# Patient Record
Sex: Female | Born: 1975 | Race: White | Hispanic: Yes | Smoking: Current every day smoker
Health system: Southern US, Community
[De-identification: ages and names within clinical notes are randomized; demographics above are authoritative.]

## PROBLEM LIST (undated history)

## (undated) DIAGNOSIS — F329 Major depressive disorder, single episode, unspecified: Secondary | ICD-10-CM

## (undated) DIAGNOSIS — F419 Anxiety disorder, unspecified: Secondary | ICD-10-CM

## (undated) DIAGNOSIS — Z5189 Encounter for other specified aftercare: Secondary | ICD-10-CM

## (undated) DIAGNOSIS — R569 Unspecified convulsions: Secondary | ICD-10-CM

## (undated) DIAGNOSIS — M1990 Unspecified osteoarthritis, unspecified site: Secondary | ICD-10-CM

## (undated) DIAGNOSIS — A879 Viral meningitis, unspecified: Secondary | ICD-10-CM

## (undated) DIAGNOSIS — F32A Depression, unspecified: Secondary | ICD-10-CM

---

## 2004-07-28 DIAGNOSIS — IMO0001 Reserved for inherently not codable concepts without codable children: Secondary | ICD-10-CM

## 2004-07-28 DIAGNOSIS — Z5189 Encounter for other specified aftercare: Secondary | ICD-10-CM

## 2004-07-28 HISTORY — PX: OTHER SURGICAL HISTORY: SHX169

## 2004-07-28 HISTORY — DX: Encounter for other specified aftercare: Z51.89

## 2004-07-28 HISTORY — DX: Reserved for inherently not codable concepts without codable children: IMO0001

## 2008-05-01 ENCOUNTER — Emergency Department (HOSPITAL_COMMUNITY): Admission: EM | Admit: 2008-05-01 | Discharge: 2008-05-01 | Payer: Self-pay | Admitting: Emergency Medicine

## 2008-05-16 ENCOUNTER — Inpatient Hospital Stay (HOSPITAL_COMMUNITY): Admission: AD | Admit: 2008-05-16 | Discharge: 2008-05-16 | Payer: Self-pay | Admitting: Obstetrics & Gynecology

## 2008-05-21 ENCOUNTER — Encounter: Payer: Self-pay | Admitting: Obstetrics and Gynecology

## 2008-05-21 ENCOUNTER — Ambulatory Visit (HOSPITAL_COMMUNITY): Admission: AD | Admit: 2008-05-21 | Discharge: 2008-05-21 | Payer: Self-pay | Admitting: Obstetrics and Gynecology

## 2008-05-21 ENCOUNTER — Ambulatory Visit: Payer: Self-pay | Admitting: Obstetrics and Gynecology

## 2008-05-23 ENCOUNTER — Emergency Department (HOSPITAL_COMMUNITY): Admission: EM | Admit: 2008-05-23 | Discharge: 2008-05-23 | Payer: Self-pay | Admitting: Emergency Medicine

## 2008-06-14 ENCOUNTER — Emergency Department (HOSPITAL_COMMUNITY): Admission: EM | Admit: 2008-06-14 | Discharge: 2008-06-14 | Payer: Self-pay | Admitting: Emergency Medicine

## 2008-06-27 ENCOUNTER — Emergency Department (HOSPITAL_COMMUNITY): Admission: EM | Admit: 2008-06-27 | Discharge: 2008-06-27 | Payer: Self-pay | Admitting: Emergency Medicine

## 2008-07-12 ENCOUNTER — Emergency Department (HOSPITAL_COMMUNITY): Admission: EM | Admit: 2008-07-12 | Discharge: 2008-07-12 | Payer: Self-pay | Admitting: Family Medicine

## 2008-07-26 ENCOUNTER — Emergency Department (HOSPITAL_COMMUNITY): Admission: EM | Admit: 2008-07-26 | Discharge: 2008-07-26 | Payer: Self-pay | Admitting: Emergency Medicine

## 2008-08-03 ENCOUNTER — Emergency Department (HOSPITAL_COMMUNITY): Admission: EM | Admit: 2008-08-03 | Discharge: 2008-08-03 | Payer: Self-pay | Admitting: Emergency Medicine

## 2008-08-24 ENCOUNTER — Inpatient Hospital Stay (HOSPITAL_COMMUNITY): Admission: AD | Admit: 2008-08-24 | Discharge: 2008-08-24 | Payer: Self-pay | Admitting: Family Medicine

## 2009-07-26 ENCOUNTER — Emergency Department (HOSPITAL_COMMUNITY): Admission: EM | Admit: 2009-07-26 | Discharge: 2009-07-26 | Payer: Self-pay | Admitting: Family Medicine

## 2009-10-30 ENCOUNTER — Emergency Department (HOSPITAL_COMMUNITY): Admission: EM | Admit: 2009-10-30 | Discharge: 2009-10-30 | Payer: Self-pay | Admitting: Emergency Medicine

## 2009-11-13 ENCOUNTER — Emergency Department (HOSPITAL_COMMUNITY): Admission: EM | Admit: 2009-11-13 | Discharge: 2009-11-13 | Payer: Self-pay | Admitting: Emergency Medicine

## 2009-12-27 ENCOUNTER — Inpatient Hospital Stay (HOSPITAL_COMMUNITY): Admission: AD | Admit: 2009-12-27 | Discharge: 2009-12-27 | Payer: Self-pay | Admitting: Obstetrics & Gynecology

## 2010-01-23 ENCOUNTER — Inpatient Hospital Stay (HOSPITAL_COMMUNITY): Admission: AD | Admit: 2010-01-23 | Discharge: 2010-01-23 | Payer: Self-pay | Admitting: Obstetrics and Gynecology

## 2010-01-23 ENCOUNTER — Ambulatory Visit: Payer: Self-pay | Admitting: Physician Assistant

## 2010-01-23 ENCOUNTER — Other Ambulatory Visit: Payer: Self-pay | Admitting: Emergency Medicine

## 2010-02-03 ENCOUNTER — Emergency Department (HOSPITAL_COMMUNITY): Admission: EM | Admit: 2010-02-03 | Discharge: 2010-02-03 | Payer: Self-pay | Admitting: Emergency Medicine

## 2010-02-13 ENCOUNTER — Emergency Department (HOSPITAL_COMMUNITY): Admission: EM | Admit: 2010-02-13 | Discharge: 2010-02-13 | Payer: Self-pay | Admitting: Emergency Medicine

## 2010-02-22 ENCOUNTER — Encounter (INDEPENDENT_AMBULATORY_CARE_PROVIDER_SITE_OTHER): Payer: Self-pay | Admitting: *Deleted

## 2010-02-22 ENCOUNTER — Ambulatory Visit: Payer: Self-pay | Admitting: Obstetrics and Gynecology

## 2010-02-22 LAB — CONVERTED CEMR LAB
Hemoglobin: 10.8 g/dL — ABNORMAL LOW (ref 12.0–15.0)
RBC: 3.63 M/uL — ABNORMAL LOW (ref 3.87–5.11)

## 2010-02-23 ENCOUNTER — Other Ambulatory Visit: Payer: Self-pay | Admitting: Emergency Medicine

## 2010-02-23 ENCOUNTER — Other Ambulatory Visit: Payer: Self-pay

## 2010-02-23 ENCOUNTER — Inpatient Hospital Stay (HOSPITAL_COMMUNITY): Admission: EM | Admit: 2010-02-23 | Discharge: 2010-02-25 | Payer: Self-pay | Admitting: Psychiatry

## 2010-02-24 ENCOUNTER — Ambulatory Visit: Payer: Self-pay | Admitting: Psychiatry

## 2010-02-28 ENCOUNTER — Ambulatory Visit (HOSPITAL_COMMUNITY): Admission: RE | Admit: 2010-02-28 | Discharge: 2010-02-28 | Payer: Self-pay | Admitting: Family Medicine

## 2010-03-17 ENCOUNTER — Emergency Department (HOSPITAL_COMMUNITY): Admission: EM | Admit: 2010-03-17 | Discharge: 2010-03-17 | Payer: Self-pay | Admitting: Emergency Medicine

## 2010-06-21 ENCOUNTER — Emergency Department (HOSPITAL_COMMUNITY): Admission: EM | Admit: 2010-06-21 | Discharge: 2010-06-21 | Payer: Self-pay | Admitting: Emergency Medicine

## 2010-06-28 ENCOUNTER — Emergency Department (HOSPITAL_COMMUNITY)
Admission: EM | Admit: 2010-06-28 | Discharge: 2010-06-28 | Payer: Self-pay | Source: Home / Self Care | Admitting: Emergency Medicine

## 2010-07-04 ENCOUNTER — Emergency Department (HOSPITAL_COMMUNITY)
Admission: EM | Admit: 2010-07-04 | Discharge: 2010-07-04 | Payer: Self-pay | Source: Home / Self Care | Admitting: Emergency Medicine

## 2010-07-15 ENCOUNTER — Emergency Department (HOSPITAL_COMMUNITY)
Admission: EM | Admit: 2010-07-15 | Discharge: 2010-07-15 | Payer: Self-pay | Source: Home / Self Care | Admitting: Emergency Medicine

## 2010-07-16 ENCOUNTER — Emergency Department (HOSPITAL_COMMUNITY)
Admission: EM | Admit: 2010-07-16 | Discharge: 2010-07-16 | Payer: Self-pay | Source: Home / Self Care | Admitting: Emergency Medicine

## 2010-07-27 ENCOUNTER — Emergency Department (HOSPITAL_COMMUNITY)
Admission: EM | Admit: 2010-07-27 | Discharge: 2010-07-27 | Payer: Self-pay | Source: Home / Self Care | Admitting: Emergency Medicine

## 2010-07-28 DIAGNOSIS — R569 Unspecified convulsions: Secondary | ICD-10-CM

## 2010-07-28 HISTORY — DX: Unspecified convulsions: R56.9

## 2010-10-11 LAB — POCT PREGNANCY, URINE: Preg Test, Ur: NEGATIVE

## 2010-10-12 LAB — RAPID URINE DRUG SCREEN, HOSP PERFORMED
Amphetamines: NOT DETECTED
Barbiturates: NOT DETECTED
Benzodiazepines: NOT DETECTED
Benzodiazepines: NOT DETECTED
Cocaine: NOT DETECTED
Cocaine: POSITIVE — AB
Tetrahydrocannabinol: POSITIVE — AB

## 2010-10-12 LAB — URINE MICROSCOPIC-ADD ON

## 2010-10-12 LAB — DIFFERENTIAL
Basophils Absolute: 0.1 10*3/uL (ref 0.0–0.1)
Basophils Absolute: 0.1 10*3/uL (ref 0.0–0.1)
Basophils Relative: 1 % (ref 0–1)
Basophils Relative: 1 % (ref 0–1)
Lymphocytes Relative: 24 % (ref 12–46)
Monocytes Relative: 6 % (ref 3–12)
Neutro Abs: 7.1 10*3/uL (ref 1.7–7.7)
Neutro Abs: 7.8 10*3/uL — ABNORMAL HIGH (ref 1.7–7.7)
Neutrophils Relative %: 74 % (ref 43–77)

## 2010-10-12 LAB — POCT I-STAT, CHEM 8
BUN: 7 mg/dL (ref 6–23)
Chloride: 105 mEq/L (ref 96–112)
HCT: 34 % — ABNORMAL LOW (ref 36.0–46.0)
Sodium: 139 mEq/L (ref 135–145)
TCO2: 23 mmol/L (ref 0–100)

## 2010-10-12 LAB — CBC
Hemoglobin: 10.4 g/dL — ABNORMAL LOW (ref 12.0–15.0)
MCHC: 33 g/dL (ref 30.0–36.0)
MCHC: 34 g/dL (ref 30.0–36.0)
Platelets: 357 10*3/uL (ref 150–400)
Platelets: 438 10*3/uL — ABNORMAL HIGH (ref 150–400)
RDW: 13.7 % (ref 11.5–15.5)
RDW: 13.8 % (ref 11.5–15.5)
WBC: 10.4 10*3/uL (ref 4.0–10.5)

## 2010-10-12 LAB — BASIC METABOLIC PANEL
Calcium: 9 mg/dL (ref 8.4–10.5)
Creatinine, Ser: 0.83 mg/dL (ref 0.4–1.2)
GFR calc non Af Amer: >60 mL/min (ref >60)
Glucose, Bld: 107 mg/dL — ABNORMAL HIGH (ref 70–99)
Sodium: 132 mEq/L — ABNORMAL LOW (ref 135–145)

## 2010-10-12 LAB — URINALYSIS, ROUTINE W REFLEX MICROSCOPIC
Glucose, UA: NEGATIVE mg/dL
Ketones, ur: 15 mg/dL — AB
Protein, ur: NEGATIVE mg/dL

## 2010-10-12 LAB — GC/CHLAMYDIA PROBE AMP, GENITAL
Chlamydia, DNA Probe: NEGATIVE
GC Probe Amp, Genital: NEGATIVE

## 2010-10-12 LAB — ETHANOL: Alcohol, Ethyl (B): 22 mg/dL — ABNORMAL HIGH (ref 0–10)

## 2010-10-12 LAB — WET PREP, GENITAL: Yeast Wet Prep HPF POC: NONE SEEN

## 2010-10-13 LAB — CBC
HCT: 36.2 % (ref 36.0–46.0)
Hemoglobin: 12.7 g/dL (ref 12.0–15.0)
MCH: 33.2 pg (ref 26.0–34.0)
MCHC: 35.1 g/dL (ref 30.0–36.0)
MCV: 94.9 fL (ref 78.0–100.0)
Platelets: 388 10*3/uL (ref 150–400)
RDW: 13.5 % (ref 11.5–15.5)
WBC: 8.4 10*3/uL (ref 4.0–10.5)

## 2010-10-13 LAB — COMPREHENSIVE METABOLIC PANEL
AST: 22 U/L (ref 0–37)
Albumin: 3.8 g/dL (ref 3.5–5.2)
BUN: 2 mg/dL — ABNORMAL LOW (ref 6–23)
Creatinine, Ser: 0.77 mg/dL (ref 0.4–1.2)
GFR calc Af Amer: >60 mL/min (ref >60)
Total Protein: 6.8 g/dL (ref 6.0–8.3)

## 2010-10-13 LAB — BASIC METABOLIC PANEL
GFR calc non Af Amer: >60 mL/min (ref >60)
Glucose, Bld: 100 mg/dL — ABNORMAL HIGH (ref 70–99)
Potassium: 3.7 mEq/L (ref 3.5–5.1)
Sodium: 136 mEq/L (ref 135–145)

## 2010-10-13 LAB — URINE MICROSCOPIC-ADD ON

## 2010-10-13 LAB — DIFFERENTIAL
Lymphocytes Relative: 27 % (ref 12–46)
Lymphocytes Relative: 27 % (ref 12–46)
Lymphs Abs: 2.3 10*3/uL (ref 0.7–4.0)
Monocytes Absolute: 0.7 10*3/uL (ref 0.1–1.0)
Monocytes Absolute: 0.6 10*3/uL (ref 0.1–1.0)
Monocytes Relative: 8 % (ref 3–12)
Monocytes Relative: 7 % (ref 3–12)
Neutro Abs: 5.3 10*3/uL (ref 1.7–7.7)
Neutro Abs: 5.2 10*3/uL (ref 1.7–7.7)

## 2010-10-13 LAB — RAPID URINE DRUG SCREEN, HOSP PERFORMED
Amphetamines: NOT DETECTED
Barbiturates: NOT DETECTED

## 2010-10-13 LAB — URINALYSIS, ROUTINE W REFLEX MICROSCOPIC
Glucose, UA: NEGATIVE mg/dL
Nitrite: NEGATIVE
Specific Gravity, Urine: 1.023 (ref 1.005–1.030)
pH: 5.5 (ref 5.0–8.0)

## 2010-10-13 LAB — TYPE AND SCREEN: Antibody Screen: POSITIVE

## 2010-10-13 LAB — ETHANOL: Alcohol, Ethyl (B): <5 mg/dL (ref 0–10)

## 2010-10-14 LAB — COMPREHENSIVE METABOLIC PANEL
ALT: 20 U/L (ref 0–35)
Albumin: 4.1 g/dL (ref 3.5–5.2)
Alkaline Phosphatase: 109 U/L (ref 39–117)
BUN: 7 mg/dL (ref 6–23)
Potassium: 3.3 mEq/L — ABNORMAL LOW (ref 3.5–5.1)
Sodium: 136 mEq/L (ref 135–145)
Total Protein: 7.5 g/dL (ref 6.0–8.3)

## 2010-10-14 LAB — URINE MICROSCOPIC-ADD ON

## 2010-10-14 LAB — CBC
Platelets: 330 10*3/uL (ref 150–400)
RDW: 13.9 % (ref 11.5–15.5)

## 2010-10-14 LAB — URINALYSIS, ROUTINE W REFLEX MICROSCOPIC
Nitrite: NEGATIVE
Specific Gravity, Urine: >1.03 — ABNORMAL HIGH (ref 1.005–1.030)
Urobilinogen, UA: 1 mg/dL (ref 0.0–1.0)

## 2010-10-14 LAB — ABO/RH: ABO/RH(D): A POS

## 2010-10-15 LAB — URINALYSIS, ROUTINE W REFLEX MICROSCOPIC
Leukocytes, UA: NEGATIVE
Specific Gravity, Urine: 1.026 (ref 1.005–1.030)
Urobilinogen, UA: 0.2 mg/dL (ref 0.0–1.0)

## 2010-10-15 LAB — URINE MICROSCOPIC-ADD ON

## 2010-10-28 LAB — POCT PREGNANCY, URINE: Preg Test, Ur: NEGATIVE

## 2010-11-03 ENCOUNTER — Emergency Department (HOSPITAL_COMMUNITY): Payer: Medicare Other

## 2010-11-03 ENCOUNTER — Emergency Department (HOSPITAL_COMMUNITY)
Admission: EM | Admit: 2010-11-03 | Discharge: 2010-11-03 | Disposition: A | Discharge status: Home / Self Care | Payer: Medicare Other | Attending: Emergency Medicine | Admitting: Emergency Medicine

## 2010-11-03 DIAGNOSIS — I10 Essential (primary) hypertension: Secondary | ICD-10-CM | POA: Insufficient documentation

## 2010-11-03 DIAGNOSIS — F1410 Cocaine abuse, uncomplicated: Secondary | ICD-10-CM | POA: Insufficient documentation

## 2010-11-03 DIAGNOSIS — G40909 Epilepsy, unspecified, not intractable, without status epilepticus: Secondary | ICD-10-CM | POA: Insufficient documentation

## 2010-11-03 DIAGNOSIS — S0003XA Contusion of scalp, initial encounter: Secondary | ICD-10-CM | POA: Insufficient documentation

## 2010-11-03 DIAGNOSIS — R296 Repeated falls: Secondary | ICD-10-CM | POA: Insufficient documentation

## 2010-11-03 LAB — POCT I-STAT, CHEM 8
BUN: 8 mg/dL (ref 6–23)
Chloride: 106 mEq/L (ref 96–112)
Creatinine, Ser: 1.1 mg/dL (ref 0.4–1.2)
Glucose, Bld: 123 mg/dL — ABNORMAL HIGH (ref 70–99)
Hemoglobin: 13.9 g/dL (ref 12.0–15.0)
Potassium: 3.6 mEq/L (ref 3.5–5.1)

## 2010-11-03 LAB — URINALYSIS, ROUTINE W REFLEX MICROSCOPIC
Bilirubin Urine: NEGATIVE
Glucose, UA: NEGATIVE mg/dL
Hgb urine dipstick: NEGATIVE
Specific Gravity, Urine: 1.007 (ref 1.005–1.030)
pH: 6 (ref 5.0–8.0)

## 2010-11-03 LAB — DIFFERENTIAL
Lymphocytes Relative: 21 % (ref 12–46)
Monocytes Absolute: 0.9 10*3/uL (ref 0.1–1.0)
Monocytes Relative: 7 % (ref 3–12)
Neutro Abs: 8.2 10*3/uL — ABNORMAL HIGH (ref 1.7–7.7)
Neutrophils Relative %: 71 % (ref 43–77)

## 2010-11-03 LAB — CBC
MCH: 30 pg (ref 26.0–34.0)
MCV: 89 fL (ref 78.0–100.0)
Platelets: 373 10*3/uL (ref 150–400)
RDW: 13 % (ref 11.5–15.5)
WBC: 11.6 10*3/uL — ABNORMAL HIGH (ref 4.0–10.5)

## 2010-11-03 LAB — ETHANOL: Alcohol, Ethyl (B): 85 mg/dL — ABNORMAL HIGH (ref 0–10)

## 2010-11-03 LAB — RAPID URINE DRUG SCREEN, HOSP PERFORMED: Cocaine: POSITIVE — AB

## 2010-11-11 LAB — GC/CHLAMYDIA PROBE AMP, GENITAL
Chlamydia, DNA Probe: NEGATIVE
GC Probe Amp, Genital: NEGATIVE

## 2010-11-11 LAB — URINALYSIS, ROUTINE W REFLEX MICROSCOPIC
Nitrite: NEGATIVE
Specific Gravity, Urine: 1.02 (ref 1.005–1.030)
Urobilinogen, UA: 0.2 mg/dL (ref 0.0–1.0)

## 2010-11-11 LAB — POCT PREGNANCY, URINE: Preg Test, Ur: POSITIVE

## 2010-12-02 ENCOUNTER — Emergency Department (HOSPITAL_COMMUNITY): Admission: EM | Admit: 2010-12-02 | Payer: Medicare Other | Source: Home / Self Care

## 2010-12-10 NOTE — Op Note (Signed)
NAMEYOSHIKA, Jasmine Fisher            ACCOUNT NO.:  [id number]   MEDICAL RECORD NO.:  [id number]          PATIENT TYPE:  AMB   LOCATION:  [facility]                          FACILITY:  [facility]   PHYSICIAN:  Tilda Burrow, M.D. DATE OF BIRTH:  April 03, 1976   DATE OF PROCEDURE:  05/21/2008  DATE OF DISCHARGE:  05/21/2008                               OPERATIVE REPORT   PREOPERATIVE DIAGNOSIS:  Missed abortion.   POSTOPERATIVE DIAGNOSIS:  Missed abortion.   PROCEDURE:  Suction, dilation, and curettage.   SURGEON:  Tilda Burrow, MD   ASSISTANT:  None.   ANESTHESIA:  Paracervical block with IV sedation.   COMPLICATIONS:  None.   FINDINGS:  Products of conception consistent with missed abortion.   DETAILS OF PROCEDURE:  The patient was taken to the operating room,  prepped and draped for vaginal procedure.  After time-out conducted and  procedure confirmed.  The legs were in lithotomy support position.  Speculum inserted and cervix grasped, sounded in the anteflexed  position, dilated adequately to introduce a 9-mm curved suction curette,  which was then used in circumferential fashion to remove blood clots and  tissues, sample consistent with missed AB.  A smooth sharp curettage was  briefly performed in all quadrants to confirm that the uniform gritty  feel had been obtained.  The procedure was considered satisfactory and  completed without noted complications.  The patient received IV  antibiotics for prophylaxis for the procedure and was discharged home  afterwards with sponge and needle counts were correct.  Blood type was  confirmed as A positive.      Tilda Burrow, M.D.  Electronically Signed     JVF/MEDQ  D:  07/01/2008  T:  07/02/2008  Job:  [id number]

## 2010-12-10 NOTE — Op Note (Signed)
Jasmine Fisher, Jasmine Fisher            ACCOUNT NO.:  [id number]   MEDICAL RECORD NO.:  [id number]          PATIENT TYPE:  AMB   LOCATION:  [facility]                          [facility]   PHYSICIAN:  Tilda Burrow, M.D. DATE OF BIRTH:  1975/10/28   DATE OF PROCEDURE:  DATE OF DISCHARGE:                               OPERATIVE REPORT   PREOPERATIVE DIAGNOSIS:  Pregnancy 10 plus 1 weeks' gestation, missed  abortion.   POSTOPERATIVE DIAGNOSIS:  Pregnancy 10 plus 1 weeks' gestation, missed  abortion.   PROCEDURE:  Suction dilation and curettage.   SURGEON:  Tilda Burrow, MD.   ASSISTANT:  None.   ANESTHESIA:  Paracervical block with IV sedation.   COMPLICATIONS:  None.   INDICATIONS:  A 35 year old gravida 3, para 2, presenting to [facility] MAU with significant bleeding since this morning with cramping,  admitted for suction D&C after ultrasound identifies a nonviable 8-week  crown-rump length with absent fetal heart motion.  Suction D&C  recommended and performed.  Blood type A positive, hemoglobin 11, and  hematocrit 33.   DETAILS OF PROCEDURE:  The patient was taken to the operating room,  paracervical block with IV sedation applied, and the patient was given  paracervical block and intracervical block with 10 mL of Xylocaine  solution.  The uterus was sounded to 10 cm dilated to 31-French allowing  introduction of curved 9-mm suction curette, which quickly obtained  appropriate tissue of fluid and products of conception.  Smooth sharp  curettage confirmed a satisfactory uterine evacuation by uniform gritty  feel obtained in all 4 quadrants.  The patient tolerated the procedure  well, went to recovery room in stable condition, and will be given a  analgesics and discharged home for followup at [facility] in 2 weeks.   Note: The patient is recently moved from [place] and has not  established Mental Health Care options and will need to seek followup  within a  brief period of time for her necessary refills on her Effexor  and Neurontin, which she takes for seizure disorder and  depression.Neurontin is usually a supplemental anti-epileptic for use in  focal motor seizures that do not respond to first medication alone.      Tilda Burrow, M.D.  Electronically Signed    JVF/MEDQ  D:  05/21/2008  T:  05/21/2008  Job:  [id number]

## 2010-12-10 NOTE — H&P (Signed)
NAMENOBLE, BODIE            ACCOUNT NO.:  [id number]   MEDICAL RECORD NO.:  [id number]          PATIENT TYPE:  AMB   LOCATION:  [facility]                          [facility]:  [facility]   PHYSICIAN:  Tilda Burrow, M.D. DATE OF BIRTH:  [birth year]   DATE OF ADMISSION:  05/21/2008  DATE OF DISCHARGE:                              HISTORY & PHYSICAL   ADMISSION DIAGNOSIS:  Missed abortion (incomplete abortion).   HISTORY OF PRESENT ILLNESS:  This 35 year old gravida 3, para 2, AB 0,  LMP 10.1 weeks ago, is admitted for suction dilation and curettage.  After presenting to the [facility] MAU, complaining of heavy  cramping and bleeding since 9 a.m. this morning.  Pregnancy is known  pregnancy with laboratory evaluation upon arrival hemoglobin 11,  hematocrit 33.7, blood type A+, quantitative HCG 4256 with urinalysis  negative.  Transvaginal ultrasound was performed, which shows an 8.2-  week crown-rump length with absent fetal heart motion.  The patient is  10 weeks 1 day by last menstrual period.  The inevitability pregnancy  loss is explained to the patient.  Past medical history is positive for  depression and seizure disorder.  The patient is recently moved from  [place] and is not established pregnancy care here nor she is  established in psychiatric care here.  She is on disability due to  alleged depression and seizure disorder.  Her medications consist of  Effexor 300 mg daily and Neurontin 400 mg q.a.m. and 800 mg q.h.s.  The  patient describes seizures as involving complete seizures with tonic  clonic jerking motion.  Seizure activity described.  Her last seizure  was reported one-to-one half years ago.  She reports that she was on  Neurontin at that time.  A review of the PDR indicates Neurontin is a  second-line agent for adjunctive therapy (added to other antiepileptic  drugs) for refractory partial seizures, but is not a normal first-line  agent or an agent to be used  singularly.   REVIEW OF SYSTEMS:  Otherwise, negative.  This was a planned pregnancy.  The patient describes herself being a under lot of stress recently  details not further declared.   PAST MEDICAL HISTORY:  Otherwise, negative.   SURGICAL HISTORY:  None.   OBSTETRICAL HISTORY:  A 2 term vaginal deliveries.  It is not clear if  the children with her.   PHYSICAL EXAMINATION:  GENERAL:  Heavily tattooed, generally healthy-  appearing female alert and oriented x3.  HEENT:  Pupils equal, round, and reactive with multiple head and neck  tattoo.  No facial piercing.  CHEST:  Clear.  ABDOMEN:  Nontender.  EXTERNAL GENITALIA:  Normal female.  Vaginal exam normal secretions.  Moderate blood present.  Cervix multiparous.  Uterus anteflexed and  anterior, 8-10 weeks size on difficulty exam due to patient body habitus  and obesity.  EXTREMITIES:  Grossly normal.  Blood type A+.   IMPRESSION:  Missed abortion (incomplete abortion suction dilation and  curettage).   PLAN:  Risks, benefits, alternatives including Cytotec and home  management reviewed with the patient and decision made to proceed  with  suction dilation and curettage.      Tilda Burrow, M.D.  Electronically Signed     JVF/MEDQ  D:  05/21/2008  T:  05/22/2008  Job:  [id number]

## 2010-12-11 ENCOUNTER — Inpatient Hospital Stay (HOSPITAL_COMMUNITY)
Admission: AD | Admit: 2010-12-11 | Discharge: 2010-12-11 | Disposition: A | Discharge status: Home / Self Care | Payer: Medicare Other | Source: Ambulatory Visit | Attending: Obstetrics and Gynecology | Admitting: Obstetrics and Gynecology

## 2010-12-11 ENCOUNTER — Inpatient Hospital Stay (HOSPITAL_COMMUNITY): Payer: Medicare Other

## 2010-12-11 DIAGNOSIS — O209 Hemorrhage in early pregnancy, unspecified: Secondary | ICD-10-CM | POA: Insufficient documentation

## 2010-12-11 LAB — CBC
Hemoglobin: 11.8 g/dL — ABNORMAL LOW (ref 12.0–15.0)
MCH: 29.5 pg (ref 26.0–34.0)
MCHC: 33.3 g/dL (ref 30.0–36.0)
MCV: 88.5 fL (ref 78.0–100.0)

## 2010-12-11 LAB — URINALYSIS, ROUTINE W REFLEX MICROSCOPIC
Glucose, UA: NEGATIVE mg/dL
Specific Gravity, Urine: <1.005 — ABNORMAL LOW (ref 1.005–1.030)
Urobilinogen, UA: 0.2 mg/dL (ref 0.0–1.0)

## 2010-12-11 LAB — WET PREP, GENITAL: Trich, Wet Prep: NONE SEEN

## 2010-12-11 LAB — URINE MICROSCOPIC-ADD ON

## 2010-12-11 LAB — ABO/RH: ABO/RH(D): A POS

## 2010-12-30 ENCOUNTER — Emergency Department (HOSPITAL_COMMUNITY)
Admission: EM | Admit: 2010-12-30 | Discharge: 2010-12-30 | Disposition: A | Discharge status: Home / Self Care | Payer: Medicare Other | Attending: Emergency Medicine | Admitting: Emergency Medicine

## 2010-12-30 DIAGNOSIS — G40909 Epilepsy, unspecified, not intractable, without status epilepticus: Secondary | ICD-10-CM | POA: Insufficient documentation

## 2010-12-30 DIAGNOSIS — IMO0002 Reserved for concepts with insufficient information to code with codable children: Secondary | ICD-10-CM | POA: Insufficient documentation

## 2010-12-30 DIAGNOSIS — K089 Disorder of teeth and supporting structures, unspecified: Secondary | ICD-10-CM | POA: Insufficient documentation

## 2010-12-30 DIAGNOSIS — R220 Localized swelling, mass and lump, head: Secondary | ICD-10-CM | POA: Insufficient documentation

## 2010-12-30 DIAGNOSIS — R221 Localized swelling, mass and lump, neck: Secondary | ICD-10-CM | POA: Insufficient documentation

## 2010-12-30 DIAGNOSIS — Z9889 Other specified postprocedural states: Secondary | ICD-10-CM | POA: Insufficient documentation

## 2010-12-30 DIAGNOSIS — J45909 Unspecified asthma, uncomplicated: Secondary | ICD-10-CM | POA: Insufficient documentation

## 2010-12-30 DIAGNOSIS — I10 Essential (primary) hypertension: Secondary | ICD-10-CM | POA: Insufficient documentation

## 2010-12-30 DIAGNOSIS — Z79899 Other long term (current) drug therapy: Secondary | ICD-10-CM | POA: Insufficient documentation

## 2010-12-30 DIAGNOSIS — K047 Periapical abscess without sinus: Secondary | ICD-10-CM | POA: Insufficient documentation

## 2010-12-30 DIAGNOSIS — K029 Dental caries, unspecified: Secondary | ICD-10-CM | POA: Insufficient documentation

## 2010-12-30 DIAGNOSIS — F3130 Bipolar disorder, current episode depressed, mild or moderate severity, unspecified: Secondary | ICD-10-CM | POA: Insufficient documentation

## 2011-01-06 ENCOUNTER — Emergency Department (HOSPITAL_COMMUNITY): Payer: Medicare Other

## 2011-01-06 ENCOUNTER — Emergency Department (HOSPITAL_COMMUNITY)
Admission: EM | Admit: 2011-01-06 | Discharge: 2011-01-06 | Disposition: A | Discharge status: Home / Self Care | Payer: Medicare Other | Attending: Emergency Medicine | Admitting: Emergency Medicine

## 2011-01-06 DIAGNOSIS — M25476 Effusion, unspecified foot: Secondary | ICD-10-CM | POA: Insufficient documentation

## 2011-01-06 DIAGNOSIS — G40909 Epilepsy, unspecified, not intractable, without status epilepticus: Secondary | ICD-10-CM | POA: Insufficient documentation

## 2011-01-06 DIAGNOSIS — X500XXA Overexertion from strenuous movement or load, initial encounter: Secondary | ICD-10-CM | POA: Insufficient documentation

## 2011-01-06 DIAGNOSIS — M25473 Effusion, unspecified ankle: Secondary | ICD-10-CM | POA: Insufficient documentation

## 2011-01-06 DIAGNOSIS — F3130 Bipolar disorder, current episode depressed, mild or moderate severity, unspecified: Secondary | ICD-10-CM | POA: Insufficient documentation

## 2011-01-06 DIAGNOSIS — M25579 Pain in unspecified ankle and joints of unspecified foot: Secondary | ICD-10-CM | POA: Insufficient documentation

## 2011-01-06 DIAGNOSIS — J45909 Unspecified asthma, uncomplicated: Secondary | ICD-10-CM | POA: Insufficient documentation

## 2011-01-06 DIAGNOSIS — S8990XA Unspecified injury of unspecified lower leg, initial encounter: Secondary | ICD-10-CM | POA: Insufficient documentation

## 2011-01-06 DIAGNOSIS — S93409A Sprain of unspecified ligament of unspecified ankle, initial encounter: Secondary | ICD-10-CM | POA: Insufficient documentation

## 2011-01-06 DIAGNOSIS — S9000XA Contusion of unspecified ankle, initial encounter: Secondary | ICD-10-CM | POA: Insufficient documentation

## 2011-01-06 DIAGNOSIS — I10 Essential (primary) hypertension: Secondary | ICD-10-CM | POA: Insufficient documentation

## 2011-01-28 ENCOUNTER — Other Ambulatory Visit (HOSPITAL_COMMUNITY)
Admission: RE | Admit: 2011-01-28 | Discharge: 2011-01-28 | Disposition: A | Discharge status: Home / Self Care | Payer: Medicare Other | Source: Ambulatory Visit | Attending: Obstetrics and Gynecology | Admitting: Obstetrics and Gynecology

## 2011-01-28 ENCOUNTER — Other Ambulatory Visit: Payer: Self-pay | Admitting: Obstetrics and Gynecology

## 2011-01-28 DIAGNOSIS — Z113 Encounter for screening for infections with a predominantly sexual mode of transmission: Secondary | ICD-10-CM | POA: Insufficient documentation

## 2011-01-28 DIAGNOSIS — Z01419 Encounter for gynecological examination (general) (routine) without abnormal findings: Secondary | ICD-10-CM | POA: Insufficient documentation

## 2011-03-05 ENCOUNTER — Encounter (HOSPITAL_COMMUNITY): Payer: Self-pay | Admitting: *Deleted

## 2011-03-05 ENCOUNTER — Inpatient Hospital Stay (HOSPITAL_COMMUNITY): Admission: RE | Admit: 2011-03-05 | Payer: Medicare Other | Source: Ambulatory Visit

## 2011-03-10 ENCOUNTER — Encounter (HOSPITAL_COMMUNITY): Payer: Self-pay | Admitting: Anesthesiology

## 2011-03-10 ENCOUNTER — Ambulatory Visit (HOSPITAL_COMMUNITY)
Admission: RE | Admit: 2011-03-10 | Discharge: 2011-03-10 | Disposition: A | Discharge status: Home / Self Care | Payer: Medicare Other | Source: Ambulatory Visit | Attending: Obstetrics and Gynecology | Admitting: Obstetrics and Gynecology

## 2011-03-10 ENCOUNTER — Ambulatory Visit (HOSPITAL_COMMUNITY): Payer: Medicare Other | Admitting: Anesthesiology

## 2011-03-10 ENCOUNTER — Encounter (HOSPITAL_COMMUNITY)
Admission: RE | Disposition: A | Discharge status: Home / Self Care | Payer: Self-pay | Source: Ambulatory Visit | Attending: Obstetrics and Gynecology

## 2011-03-10 DIAGNOSIS — Z302 Encounter for sterilization: Secondary | ICD-10-CM | POA: Insufficient documentation

## 2011-03-10 DIAGNOSIS — Z01818 Encounter for other preprocedural examination: Secondary | ICD-10-CM | POA: Insufficient documentation

## 2011-03-10 HISTORY — DX: Major depressive disorder, single episode, unspecified: F32.9

## 2011-03-10 HISTORY — PX: TUBAL LIGATION: SHX77

## 2011-03-10 HISTORY — DX: Depression, unspecified: F32.A

## 2011-03-10 HISTORY — DX: Encounter for other specified aftercare: Z51.89

## 2011-03-10 HISTORY — DX: Unspecified osteoarthritis, unspecified site: M19.90

## 2011-03-10 HISTORY — DX: Unspecified convulsions: R56.9

## 2011-03-10 HISTORY — DX: Anxiety disorder, unspecified: F41.9

## 2011-03-10 LAB — CBC
HCT: 38.7 % (ref 36.0–46.0)
Hemoglobin: 12.7 g/dL (ref 12.0–15.0)
MCV: 89.6 fL (ref 78.0–100.0)
RDW: 13.5 % (ref 11.5–15.5)
WBC: 9.7 10*3/uL (ref 4.0–10.5)

## 2011-03-10 SURGERY — ESSURE TUBAL STERILIZATION
Anesthesia: General | Site: Vagina | Laterality: Bilateral | Wound class: Clean Contaminated

## 2011-03-10 MED ORDER — PROPOFOL 10 MG/ML IV EMUL
INTRAVENOUS | Status: AC
  Filled 2011-03-10: qty 50

## 2011-03-10 MED ORDER — ONDANSETRON HCL 4 MG/2ML IJ SOLN
INTRAMUSCULAR | Status: AC
  Filled 2011-03-10: qty 2

## 2011-03-10 MED ORDER — FENTANYL CITRATE 0.05 MG/ML IJ SOLN
INTRAMUSCULAR | Status: DC | PRN
  Administered 2011-03-10 (×3): 50 ug via INTRAVENOUS
  Administered 2011-03-10: 100 ug via INTRAVENOUS

## 2011-03-10 MED ORDER — GLYCOPYRROLATE 0.2 MG/ML IJ SOLN
INTRAMUSCULAR | Status: AC
  Filled 2011-03-10: qty 1

## 2011-03-10 MED ORDER — LIDOCAINE HCL (CARDIAC) 20 MG/ML IV SOLN
INTRAVENOUS | Status: AC
  Filled 2011-03-10: qty 5

## 2011-03-10 MED ORDER — PROPOFOL 10 MG/ML IV EMUL
INTRAVENOUS | Status: AC
  Filled 2011-03-10: qty 20

## 2011-03-10 MED ORDER — ONDANSETRON HCL 4 MG/2ML IJ SOLN
INTRAMUSCULAR | Status: DC | PRN
  Administered 2011-03-10: 4 mg via INTRAVENOUS

## 2011-03-10 MED ORDER — PROPOFOL 10 MG/ML IV EMUL
INTRAVENOUS | Status: DC | PRN
  Administered 2011-03-10: 200 mg via INTRAVENOUS

## 2011-03-10 MED ORDER — METOCLOPRAMIDE HCL 10 MG PO TABS: 10.0000 mg | ORAL_TABLET | Freq: Once | ORAL | Status: DC | PRN

## 2011-03-10 MED ORDER — PANTOPRAZOLE SODIUM 40 MG PO TBEC: 40.0000 mg | DELAYED_RELEASE_TABLET | Freq: Once | ORAL | Status: DC | PRN

## 2011-03-10 MED ORDER — FENTANYL CITRATE 0.05 MG/ML IJ SOLN
INTRAMUSCULAR | Status: AC
  Filled 2011-03-10: qty 5

## 2011-03-10 MED ORDER — LIDOCAINE HCL (CARDIAC) 20 MG/ML IV SOLN
INTRAVENOUS | Status: DC | PRN
  Administered 2011-03-10: 20 mg via INTRAVENOUS

## 2011-03-10 MED ORDER — SCOPOLAMINE 1 MG/3DAYS TD PT72: 1.0000 | MEDICATED_PATCH | Freq: Once | TRANSDERMAL | Status: DC | PRN

## 2011-03-10 MED ORDER — LACTATED RINGERS IV SOLN
INTRAVENOUS | Status: DC
  Administered 2011-03-10: 1000 mL via INTRAVENOUS
  Administered 2011-03-10: 13:00:00 via INTRAVENOUS

## 2011-03-10 MED ORDER — DEXAMETHASONE SODIUM PHOSPHATE 10 MG/ML IJ SOLN
INTRAMUSCULAR | Status: DC | PRN
  Administered 2011-03-10: 10 mg via INTRAVENOUS

## 2011-03-10 MED ORDER — KETOROLAC TROMETHAMINE 30 MG/ML IJ SOLN
INTRAMUSCULAR | Status: DC | PRN
  Administered 2011-03-10: 30 mg via INTRAVENOUS

## 2011-03-10 MED ORDER — MIDAZOLAM HCL 5 MG/5ML IJ SOLN
INTRAMUSCULAR | Status: DC | PRN
Start: 2011-03-10 — End: 2011-03-10
  Administered 2011-03-10: 2 mg via INTRAVENOUS

## 2011-03-10 MED ORDER — CITRIC ACID-SODIUM CITRATE 334-500 MG/5ML PO SOLN: 30.0000 mL | Freq: Once | ORAL | Status: DC | PRN

## 2011-03-10 MED ORDER — MIDAZOLAM HCL 2 MG/2ML IJ SOLN
INTRAMUSCULAR | Status: AC
  Filled 2011-03-10: qty 2

## 2011-03-10 MED ORDER — DEXAMETHASONE SODIUM PHOSPHATE 10 MG/ML IJ SOLN
INTRAMUSCULAR | Status: AC
  Filled 2011-03-10: qty 1

## 2011-03-10 MED ORDER — KETOROLAC TROMETHAMINE 30 MG/ML IJ SOLN
INTRAMUSCULAR | Status: AC
  Filled 2011-03-10: qty 1

## 2011-03-10 MED ORDER — FAMOTIDINE 20 MG PO TABS: 20.0000 mg | ORAL_TABLET | Freq: Once | ORAL | Status: DC | PRN

## 2011-03-10 SURGICAL SUPPLY — 12 items
CATH ROBINSON RED A/P 16FR (CATHETERS) ×2 IMPLANT
CLOTH BEACON ORANGE TIMEOUT ST (SAFETY) ×2 IMPLANT
CONTAINER PREFILL 10% NBF 60ML (FORM) IMPLANT
GLOVE BIO SURGEON STRL SZ7 (GLOVE) ×2 IMPLANT
GLOVE BIOGEL PI IND STRL 7.0 (GLOVE) ×2 IMPLANT
GLOVE BIOGEL PI INDICATOR 7.0 (GLOVE) ×2
GOWN PREVENTION PLUS LG XLONG (DISPOSABLE) ×2 IMPLANT
GOWN PREVENTION PLUS XLARGE (GOWN DISPOSABLE) ×2 IMPLANT
KIT ESSURE FALLOPIAN CLOSURE (Ring) ×4 IMPLANT
PACK HYSTEROSCOPY LF (CUSTOM PROCEDURE TRAY) ×2 IMPLANT
TOWEL OR 17X24 6PK STRL BLUE (TOWEL DISPOSABLE) ×4 IMPLANT
WATER STERILE IRR 1000ML POUR (IV SOLUTION) IMPLANT

## 2011-03-10 NOTE — Anesthesia Procedure Notes (Addendum)
Procedure Name: LMA Insertion Date/Time: 03/10/2011 12:56 PM Performed by: Lincoln Brigham Pre-anesthesia Checklist: Patient identified, Timeout performed, Emergency Drugs available, Suction available and Patient being monitored Patient Re-evaluated:Patient Re-evaluated prior to inductionOxygen Delivery Method: Circle System Utilized Preoxygenation: Pre-oxygenation with 100% oxygen Intubation Type: IV induction Ventilation: Mask ventilation without difficulty LMA: LMA inserted LMA Size: 4.0

## 2011-03-10 NOTE — Anesthesia Postprocedure Evaluation (Signed)
Anesthesia Post Note  Patient: Jasmine Fisher  Procedure(s) Performed:  ESSURE TUBAL STERILIZATION  Anesthesia type: General  Patient location: PACU  Post pain: Pain level controlled  Post assessment: Post-op Vital signs reviewed  Last Vitals:  Filed Vitals:   03/10/11 1400  BP: 153/63  Pulse: 106  Temp:   Resp: 18    Post vital signs: Reviewed  Level of consciousness: sedated  Complications: No apparent anesthesia complicationsfj
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
## 2011-03-10 NOTE — Transfer of Care (Signed)
Immediate Anesthesia Transfer of Care Note  Patient: Jasmine Fisher  Procedure(s) Performed:  ESSURE TUBAL STERILIZATION  Patient Location: PACU  Anesthesia Type: General  Level of Consciousness: awake, alert  and oriented  Airway & Oxygen Therapy: Patient Spontanous Breathing and Patient connected to nasal cannula oxygen  Post-op Assessment: Report given to PACU RN and Post -op Vital signs reviewed and stable  Post vital signs: Reviewed and stable  Complications: No apparent anesthesia complications

## 2011-03-10 NOTE — Progress Notes (Signed)
Pt is upset at this time because she has been having chronic bilateral knee pain. Pt states that she was under the impression that MD was going to give her a RX for same.  After RN spoke with Dr. Neva Seat, MD states that pt needs to follow up with her PCP for pain.  Pt was give an RX of Ibuprofen for pain until she can follow up with her doctor.  PT states that Ibuprofen makes the pt sleepy and doesn't help pain.  MD aware, again states that pt can follow up with her PCP for chronic knee pain.  Pt verbalized understanding.

## 2011-03-10 NOTE — Progress Notes (Signed)
Pt is refusing to take RX for Ibuprofen. Pt states that she will have to go to [facility] ED for follow up with chronic knee pain.  RN has encouraged pt to take RX for knee pain.  Pt appears to be upset that she is not receiving narcotic pain medications for knee pain.  Pt states that she wants to go home now.  RN was able to instruct patient on what to expect post operative from surgery.  Pt verbalized understanding of same.  RN encouraged to go home and get some rest to see if knee pain would ease off.  RN encouraged pt to make an appointment with PCP for knee pain.
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
## 2011-03-10 NOTE — Brief Op Note (Signed)
03/10/2011  1:49 PM  PATIENT:  Jasmine Fisher  35 y.o. female  PRE-OPERATIVE DIAGNOSIS:  Desires Sterilization  POST-OPERATIVE DIAGNOSIS:  Desires Sterilization  PROCEDURE:  Procedure(s): ESSURE TUBAL STERILIZATION  SURGEON:  Surgeon(s): Fortino Sic, MD  PHYSICIAN ASSISTANT:   ASSISTANTS: none   ANESTHESIA:   general  ESTIMATED BLOOD LOSS: * No blood loss amount entered *   BLOOD ADMINISTERED:none  DRAINS: none   LOCAL MEDICATIONS USED:  NONE  SPECIMEN:  No Specimen  DISPOSITION OF SPECIMEN:  N/A  COUNTS:  YES  TOURNIQUET:  * No tourniquets in log *  DICTATION #: pending  PLAN OF CARE: Discharge home  PATIENT DISPOSITION:  PACU - hemodynamically stable.   Delay start of Pharmacological VTE agent (>24hrs) due to surgical blood loss or risk of bleeding:  not applicable

## 2011-03-10 NOTE — OR Nursing (Signed)
Case set up initiated @1030  in room 4. Previous case ran over in room 8 for same surgeon, delaying this case. Schedule rearranged for this case to be moved to follow in room 8.

## 2011-03-10 NOTE — Anesthesia Preprocedure Evaluation (Addendum)
Anesthesia Evaluation  Name, MR# and DOB Patient awake  General Assessment Comment  Reviewed: Allergy & Precautions, H&P , Patient's Chart, lab work & pertinent test results, reviewed documented beta blocker date and time   History of Anesthesia Complications Negative for: history of anesthetic complications  Airway Mallampati: II TM Distance: >3 FB Neck ROM: full    Dental No notable dental hx.    Pulmonary  clear to auscultation  pulmonary exam normalPulmonary Exam Normal breath sounds clear to auscultation none    Cardiovascular Exercise Tolerance: Good regular Normal    Neuro/Psych   Seizures -, Well Controlled,  (+) PSYCHIATRIC DISORDERS,  Negative Neurological ROS  Negative Psych ROS  GI/Hepatic/Renal negative GI ROS, negative Liver ROS, and negative Renal ROS (+)       Endo/Other  Negative Endocrine ROS (+)   Morbid obesity  Abdominal   Musculoskeletal   Hematology negative hematology ROS (+)   Peds  Reproductive/Obstetrics negative OB ROS    Anesthesia Other Findings Chronic DJD with chronic pain Bipolar Borderline personality disorder Morbid obesity Seizures well controlled            Anesthesia Physical Anesthesia Plan  ASA: III  Anesthesia Plan: General   Post-op Pain Management:    Induction:   Airway Management Planned:   Additional Equipment:   Intra-op Plan:   Post-operative Plan:   Informed Consent: I have reviewed the patients History and Physical, chart, labs and discussed the procedure including the risks, benefits and alternatives for the proposed anesthesia with the patient or authorized representative who has indicated his/her understanding and acceptance.   Dental Advisory Given  Plan Discussed with: CRNA and Surgeon  Anesthesia Plan Comments:         Anesthesia Quick Evaluation

## 2011-03-22 ENCOUNTER — Emergency Department (HOSPITAL_COMMUNITY)
Admission: EM | Admit: 2011-03-22 | Discharge: 2011-03-22 | Disposition: A | Discharge status: Home / Self Care | Payer: Medicare Other | Attending: Emergency Medicine | Admitting: Emergency Medicine

## 2011-03-22 ENCOUNTER — Emergency Department (HOSPITAL_COMMUNITY): Payer: Medicare Other

## 2011-03-22 DIAGNOSIS — M546 Pain in thoracic spine: Secondary | ICD-10-CM | POA: Insufficient documentation

## 2011-03-22 DIAGNOSIS — G40909 Epilepsy, unspecified, not intractable, without status epilepticus: Secondary | ICD-10-CM | POA: Insufficient documentation

## 2011-03-22 DIAGNOSIS — Z79899 Other long term (current) drug therapy: Secondary | ICD-10-CM | POA: Insufficient documentation

## 2011-03-22 DIAGNOSIS — M542 Cervicalgia: Secondary | ICD-10-CM | POA: Insufficient documentation

## 2011-03-22 DIAGNOSIS — R000 Tachycardia, unspecified: Secondary | ICD-10-CM | POA: Insufficient documentation

## 2011-03-22 DIAGNOSIS — F1410 Cocaine abuse, uncomplicated: Secondary | ICD-10-CM | POA: Insufficient documentation

## 2011-03-22 DIAGNOSIS — F1010 Alcohol abuse, uncomplicated: Secondary | ICD-10-CM | POA: Insufficient documentation

## 2011-03-22 DIAGNOSIS — R55 Syncope and collapse: Secondary | ICD-10-CM | POA: Insufficient documentation

## 2011-03-22 LAB — RAPID URINE DRUG SCREEN, HOSP PERFORMED
Amphetamines: POSITIVE — AB
Benzodiazepines: POSITIVE — AB
Opiates: NOT DETECTED
Tetrahydrocannabinol: NOT DETECTED

## 2011-03-22 LAB — CBC
MCHC: 34.9 g/dL (ref 30.0–36.0)
Platelets: 394 10*3/uL (ref 150–400)
RDW: 13.3 % (ref 11.5–15.5)
WBC: 10.8 10*3/uL — ABNORMAL HIGH (ref 4.0–10.5)

## 2011-03-22 LAB — URINALYSIS, ROUTINE W REFLEX MICROSCOPIC
Glucose, UA: NEGATIVE mg/dL
Ketones, ur: 15 mg/dL — AB
Protein, ur: NEGATIVE mg/dL
Urobilinogen, UA: 0.2 mg/dL (ref 0.0–1.0)

## 2011-03-22 LAB — BASIC METABOLIC PANEL
BUN: 16 mg/dL (ref 6–23)
Calcium: 9.8 mg/dL (ref 8.4–10.5)
GFR calc Af Amer: >60 mL/min (ref >60)
GFR calc non Af Amer: >60 mL/min (ref >60)
Glucose, Bld: 115 mg/dL — ABNORMAL HIGH (ref 70–99)
Potassium: 3.3 mEq/L — ABNORMAL LOW (ref 3.5–5.1)
Sodium: 138 mEq/L (ref 135–145)

## 2011-03-22 LAB — URINE MICROSCOPIC-ADD ON

## 2011-04-02 ENCOUNTER — Encounter (HOSPITAL_COMMUNITY): Payer: Self-pay | Admitting: Obstetrics and Gynecology

## 2011-04-22 ENCOUNTER — Emergency Department (HOSPITAL_COMMUNITY): Payer: Medicare Other

## 2011-04-22 ENCOUNTER — Emergency Department (HOSPITAL_COMMUNITY)
Admission: EM | Admit: 2011-04-22 | Discharge: 2011-04-22 | Disposition: A | Discharge status: Home / Self Care | Payer: Medicare Other | Attending: Emergency Medicine | Admitting: Emergency Medicine

## 2011-04-22 DIAGNOSIS — M549 Dorsalgia, unspecified: Secondary | ICD-10-CM | POA: Insufficient documentation

## 2011-04-22 DIAGNOSIS — M25569 Pain in unspecified knee: Secondary | ICD-10-CM | POA: Insufficient documentation

## 2011-04-22 DIAGNOSIS — F17200 Nicotine dependence, unspecified, uncomplicated: Secondary | ICD-10-CM | POA: Insufficient documentation

## 2011-04-29 LAB — CBC
HCT: 33.7 — ABNORMAL LOW
Hemoglobin: 11.5 — ABNORMAL LOW
MCHC: 34.1
MCV: 93.8
Platelets: 323
RDW: 13.3

## 2011-04-29 LAB — WET PREP, GENITAL: Yeast Wet Prep HPF POC: NONE SEEN

## 2011-04-29 LAB — URINALYSIS, ROUTINE W REFLEX MICROSCOPIC
Bilirubin Urine: NEGATIVE
Glucose, UA: NEGATIVE
Hgb urine dipstick: NEGATIVE
Ketones, ur: NEGATIVE
Protein, ur: NEGATIVE
Urobilinogen, UA: 0.2

## 2011-05-04 ENCOUNTER — Emergency Department (HOSPITAL_COMMUNITY)
Admission: EM | Admit: 2011-05-04 | Discharge: 2011-05-04 | Disposition: A | Discharge status: Home / Self Care | Payer: Medicare Other | Attending: Emergency Medicine | Admitting: Emergency Medicine

## 2011-05-04 ENCOUNTER — Emergency Department (HOSPITAL_COMMUNITY): Payer: Medicare Other

## 2011-05-04 DIAGNOSIS — H9209 Otalgia, unspecified ear: Secondary | ICD-10-CM | POA: Insufficient documentation

## 2011-05-04 DIAGNOSIS — R059 Cough, unspecified: Secondary | ICD-10-CM | POA: Insufficient documentation

## 2011-05-04 DIAGNOSIS — J3489 Other specified disorders of nose and nasal sinuses: Secondary | ICD-10-CM | POA: Insufficient documentation

## 2011-05-04 DIAGNOSIS — G40909 Epilepsy, unspecified, not intractable, without status epilepticus: Secondary | ICD-10-CM | POA: Insufficient documentation

## 2011-05-04 DIAGNOSIS — J40 Bronchitis, not specified as acute or chronic: Secondary | ICD-10-CM | POA: Insufficient documentation

## 2011-05-04 DIAGNOSIS — R05 Cough: Secondary | ICD-10-CM | POA: Insufficient documentation

## 2011-05-04 DIAGNOSIS — Z79899 Other long term (current) drug therapy: Secondary | ICD-10-CM | POA: Insufficient documentation

## 2011-05-04 DIAGNOSIS — R070 Pain in throat: Secondary | ICD-10-CM | POA: Insufficient documentation

## 2011-05-04 DIAGNOSIS — R062 Wheezing: Secondary | ICD-10-CM | POA: Insufficient documentation

## 2011-05-04 DIAGNOSIS — R079 Chest pain, unspecified: Secondary | ICD-10-CM | POA: Insufficient documentation

## 2011-05-04 DIAGNOSIS — R0602 Shortness of breath: Secondary | ICD-10-CM | POA: Insufficient documentation

## 2011-05-27 ENCOUNTER — Emergency Department (HOSPITAL_COMMUNITY)
Admission: EM | Admit: 2011-05-27 | Discharge: 2011-05-27 | Disposition: A | Discharge status: Home / Self Care | Payer: Medicare Other | Attending: Emergency Medicine | Admitting: Emergency Medicine

## 2011-05-27 DIAGNOSIS — M25469 Effusion, unspecified knee: Secondary | ICD-10-CM | POA: Insufficient documentation

## 2011-05-27 DIAGNOSIS — G40909 Epilepsy, unspecified, not intractable, without status epilepticus: Secondary | ICD-10-CM | POA: Insufficient documentation

## 2011-05-27 DIAGNOSIS — M25569 Pain in unspecified knee: Secondary | ICD-10-CM | POA: Insufficient documentation

## 2011-05-27 DIAGNOSIS — K089 Disorder of teeth and supporting structures, unspecified: Secondary | ICD-10-CM | POA: Insufficient documentation

## 2011-05-27 DIAGNOSIS — R262 Difficulty in walking, not elsewhere classified: Secondary | ICD-10-CM | POA: Insufficient documentation

## 2011-05-27 DIAGNOSIS — F329 Major depressive disorder, single episode, unspecified: Secondary | ICD-10-CM | POA: Insufficient documentation

## 2011-05-27 DIAGNOSIS — F3289 Other specified depressive episodes: Secondary | ICD-10-CM | POA: Insufficient documentation

## 2011-05-27 DIAGNOSIS — Z79899 Other long term (current) drug therapy: Secondary | ICD-10-CM | POA: Insufficient documentation

## 2011-06-29 ENCOUNTER — Encounter (HOSPITAL_COMMUNITY): Payer: Self-pay | Admitting: *Deleted

## 2011-06-29 ENCOUNTER — Emergency Department (HOSPITAL_COMMUNITY)
Admission: EM | Admit: 2011-06-29 | Discharge: 2011-06-29 | Disposition: A | Discharge status: Home / Self Care | Payer: Medicare Other | Attending: Emergency Medicine | Admitting: Emergency Medicine

## 2011-06-29 DIAGNOSIS — F17200 Nicotine dependence, unspecified, uncomplicated: Secondary | ICD-10-CM | POA: Insufficient documentation

## 2011-06-29 DIAGNOSIS — R61 Generalized hyperhidrosis: Secondary | ICD-10-CM | POA: Insufficient documentation

## 2011-06-29 DIAGNOSIS — R569 Unspecified convulsions: Secondary | ICD-10-CM

## 2011-06-29 DIAGNOSIS — Z79899 Other long term (current) drug therapy: Secondary | ICD-10-CM | POA: Insufficient documentation

## 2011-06-29 DIAGNOSIS — G40802 Other epilepsy, not intractable, without status epilepticus: Secondary | ICD-10-CM | POA: Insufficient documentation

## 2011-06-29 LAB — CBC
HCT: 33.5 % — ABNORMAL LOW (ref 36.0–46.0)
Hemoglobin: 11.3 g/dL — ABNORMAL LOW (ref 12.0–15.0)
MCHC: 33.7 g/dL (ref 30.0–36.0)
WBC: 8.9 10*3/uL (ref 4.0–10.5)

## 2011-06-29 LAB — DIFFERENTIAL
Basophils Absolute: 0 10*3/uL (ref 0.0–0.1)
Basophils Relative: 1 % (ref 0–1)
Lymphocytes Relative: 41 % (ref 12–46)
Monocytes Absolute: 0.6 10*3/uL (ref 0.1–1.0)
Monocytes Relative: 7 % (ref 3–12)
Neutro Abs: 4.5 10*3/uL (ref 1.7–7.7)
Neutrophils Relative %: 50 % (ref 43–77)

## 2011-06-29 LAB — COMPREHENSIVE METABOLIC PANEL
BUN: 10 mg/dL (ref 6–23)
CO2: 21 mEq/L (ref 19–32)
Calcium: 8.5 mg/dL (ref 8.4–10.5)
Creatinine, Ser: 0.75 mg/dL (ref 0.50–1.10)
GFR calc Af Amer: >90 mL/min (ref >90)
GFR calc non Af Amer: >90 mL/min (ref >90)
Glucose, Bld: 106 mg/dL — ABNORMAL HIGH (ref 70–99)
Total Protein: 6.6 g/dL (ref 6.0–8.3)

## 2011-06-29 LAB — GLUCOSE, CAPILLARY: Glucose-Capillary: 101 mg/dL — ABNORMAL HIGH (ref 70–99)

## 2011-06-29 MED ORDER — HYDROCODONE-ACETAMINOPHEN 5-325 MG PO TABS: 1.0000 | ORAL_TABLET | Freq: Three times a day (TID) | ORAL | Status: AC | PRN

## 2011-06-29 MED ORDER — KETOROLAC TROMETHAMINE 30 MG/ML IJ SOLN
30.0000 mg | Freq: Once | INTRAMUSCULAR | Status: AC
  Administered 2011-06-29: 30 mg via INTRAVENOUS
  Filled 2011-06-29: qty 1

## 2011-06-29 MED ORDER — SODIUM CHLORIDE 0.9 % IV BOLUS (SEPSIS)
1000.0000 mL | Freq: Once | INTRAVENOUS | Status: AC
  Administered 2011-06-29: 1000 mL via INTRAVENOUS

## 2011-06-29 MED ORDER — LORAZEPAM 2 MG/ML IJ SOLN: 2.0000 mg | Freq: Once | INTRAMUSCULAR | Status: DC

## 2011-06-29 NOTE — ED Notes (Signed)
ZOX:[id number]<AV> Expected date:06/29/11<BR> Expected time: 1:20 AM<BR> Means of arrival:Ambulance<BR> Comments:<BR> ETOH-Seizures
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
## 2011-06-29 NOTE — ED Notes (Signed)
Dr Jeraldine Loots to discuss plan of care with friends at bedside

## 2011-07-20 ENCOUNTER — Other Ambulatory Visit: Payer: Self-pay

## 2011-07-20 ENCOUNTER — Encounter (HOSPITAL_COMMUNITY): Payer: Self-pay | Admitting: Emergency Medicine

## 2011-07-20 ENCOUNTER — Emergency Department (HOSPITAL_COMMUNITY): Payer: Medicare Other

## 2011-07-20 ENCOUNTER — Emergency Department (HOSPITAL_COMMUNITY)
Admission: EM | Admit: 2011-07-20 | Discharge: 2011-07-20 | Disposition: A | Discharge status: Home / Self Care | Payer: Medicare Other | Attending: Emergency Medicine | Admitting: Emergency Medicine

## 2011-07-20 DIAGNOSIS — M79609 Pain in unspecified limb: Secondary | ICD-10-CM | POA: Insufficient documentation

## 2011-07-20 DIAGNOSIS — M79603 Pain in arm, unspecified: Secondary | ICD-10-CM

## 2011-07-20 DIAGNOSIS — F17200 Nicotine dependence, unspecified, uncomplicated: Secondary | ICD-10-CM | POA: Insufficient documentation

## 2011-07-20 LAB — CBC
MCV: 87.1 fL (ref 78.0–100.0)
Platelets: 347 10*3/uL (ref 150–400)
RBC: 4.33 MIL/uL (ref 3.87–5.11)
RDW: 13 % (ref 11.5–15.5)
WBC: 9.6 10*3/uL (ref 4.0–10.5)

## 2011-07-20 LAB — D-DIMER, QUANTITATIVE: D-Dimer, Quant: <0.22 ug/mL-FEU (ref 0.00–0.48)

## 2011-07-20 LAB — BASIC METABOLIC PANEL
CO2: 24 mEq/L (ref 19–32)
Calcium: 9.7 mg/dL (ref 8.4–10.5)
Chloride: 101 mEq/L (ref 96–112)
Creatinine, Ser: 0.8 mg/dL (ref 0.50–1.10)
GFR calc Af Amer: >90 mL/min (ref >90)
Sodium: 134 mEq/L — ABNORMAL LOW (ref 135–145)

## 2011-07-20 MED ORDER — LORAZEPAM 2 MG/ML IJ SOLN
1.0000 mg | Freq: Once | INTRAMUSCULAR | Status: AC
  Administered 2011-07-20: 1 mg via INTRAVENOUS
  Filled 2011-07-20: qty 1

## 2011-07-20 MED ORDER — KETOROLAC TROMETHAMINE 30 MG/ML IJ SOLN
30.0000 mg | Freq: Once | INTRAMUSCULAR | Status: AC
  Administered 2011-07-20: 30 mg via INTRAVENOUS
  Filled 2011-07-20: qty 1

## 2011-07-20 MED ORDER — SODIUM CHLORIDE 0.9 % IV BOLUS (SEPSIS)
1000.0000 mL | Freq: Once | INTRAVENOUS | Status: AC
  Administered 2011-07-20: 1000 mL via INTRAVENOUS

## 2011-07-20 NOTE — ED Notes (Signed)
C/o R arm pain since yesterday.  No known injury.  Pt reports history of arthritis.  States she is also having R sided back pain.

## 2011-07-20 NOTE — ED Notes (Signed)
Pt. Discharged to home, pt. Alert and oriented, ambulatory, gait steady, NAD noted 
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
## 2011-10-06 ENCOUNTER — Encounter (HOSPITAL_COMMUNITY): Payer: Self-pay | Admitting: Emergency Medicine

## 2011-10-06 ENCOUNTER — Emergency Department (HOSPITAL_COMMUNITY)
Admission: EM | Admit: 2011-10-06 | Discharge: 2011-10-06 | Disposition: A | Discharge status: Home / Self Care | Payer: Medicare Other | Attending: Emergency Medicine | Admitting: Emergency Medicine

## 2011-10-06 DIAGNOSIS — Z0389 Encounter for observation for other suspected diseases and conditions ruled out: Secondary | ICD-10-CM | POA: Insufficient documentation

## 2011-10-06 NOTE — ED Notes (Signed)
Pt alert and walking throughout waiting room. Appears in nad.

## 2011-10-06 NOTE — ED Notes (Signed)
Pt noted to be sitting upright on stretcher having conversation on cell phone, speaking in loud tone, interm cursing; pt then left room with all belongings on person

## 2011-10-06 NOTE — ED Notes (Signed)
Rt arm and neck pain and rt back pain hurts to bend neck  Last 2 days  No injury has sz

## 2011-12-07 ENCOUNTER — Encounter (HOSPITAL_COMMUNITY): Payer: Self-pay

## 2011-12-07 ENCOUNTER — Emergency Department (HOSPITAL_COMMUNITY)
Admission: EM | Admit: 2011-12-07 | Discharge: 2011-12-07 | Disposition: A | Discharge status: Home / Self Care | Payer: Medicare Other | Attending: Emergency Medicine | Admitting: Emergency Medicine

## 2011-12-07 DIAGNOSIS — K029 Dental caries, unspecified: Secondary | ICD-10-CM | POA: Insufficient documentation

## 2011-12-07 DIAGNOSIS — F411 Generalized anxiety disorder: Secondary | ICD-10-CM | POA: Insufficient documentation

## 2011-12-07 DIAGNOSIS — F329 Major depressive disorder, single episode, unspecified: Secondary | ICD-10-CM | POA: Insufficient documentation

## 2011-12-07 DIAGNOSIS — F3289 Other specified depressive episodes: Secondary | ICD-10-CM | POA: Insufficient documentation

## 2011-12-07 DIAGNOSIS — K0889 Other specified disorders of teeth and supporting structures: Secondary | ICD-10-CM

## 2011-12-07 DIAGNOSIS — F17200 Nicotine dependence, unspecified, uncomplicated: Secondary | ICD-10-CM | POA: Insufficient documentation

## 2011-12-07 DIAGNOSIS — M129 Arthropathy, unspecified: Secondary | ICD-10-CM | POA: Insufficient documentation

## 2011-12-07 DIAGNOSIS — Z79899 Other long term (current) drug therapy: Secondary | ICD-10-CM | POA: Insufficient documentation

## 2011-12-07 DIAGNOSIS — K089 Disorder of teeth and supporting structures, unspecified: Secondary | ICD-10-CM | POA: Insufficient documentation

## 2011-12-07 MED ORDER — ACETAMINOPHEN-CODEINE #3 300-30 MG PO TABS
2.0000 | ORAL_TABLET | Freq: Once | ORAL | Status: AC
  Administered 2011-12-07: 2 via ORAL
  Filled 2011-12-07: qty 2

## 2011-12-07 MED ORDER — ACETAMINOPHEN-CODEINE #3 300-30 MG PO TABS: 1.0000 | ORAL_TABLET | Freq: Four times a day (QID) | ORAL | Status: AC | PRN

## 2011-12-07 MED ORDER — PENICILLIN V POTASSIUM 500 MG PO TABS: 500.0000 mg | ORAL_TABLET | Freq: Three times a day (TID) | ORAL | Status: AC

## 2011-12-07 NOTE — ED Notes (Signed)
Pt states pain in mouth has been ongoing for several days

## 2011-12-07 NOTE — ED Notes (Signed)
[id number]> Expected date:<BR> Expected time: 4:50 PM<BR> Means of arrival:Ambulance<BR> Comments:<BR> M61 -- Dental Pain

## 2011-12-07 NOTE — ED Provider Notes (Signed)
Medical screening examination/treatment/procedure(s) were performed by non-physician practitioner and as supervising physician I was immediately available for consultation/collaboration. Devoria Albe, MD, Armando Gang   Ward Givens, MD 12/07/11 [phone]
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
## 2011-12-07 NOTE — Discharge Instructions (Signed)
Apply warm compresses to jaw throughout the day. Take antibiotic and completion. Take tylenol #3 as directed, as needed for pain but do not drive or operate machinery with pain medication use. Followup with a dentist is very important for ongoing evaluation and management of recurrent dental pain. However return to emergency department for emergent changing or worsening symptoms. Call the dentist tomorrow to schedule close follow up.    Dental Pain A tooth ache may be caused by cavities (tooth decay). Cavities expose the nerve of the tooth to air and hot or cold temperatures. It may come from an infection or abscess (also called a boil or furuncle) around your tooth. It is also often caused by dental caries (tooth decay). This causes the pain you are having. DIAGNOSIS  Your caregiver can diagnose this problem by exam. TREATMENT   If caused by an infection, it may be treated with medications which kill germs (antibiotics) and pain medications as prescribed by your caregiver. Take medications as directed.   Only take over-the-counter or prescription medicines for pain, discomfort, or fever as directed by your caregiver.   Whether the tooth ache today is caused by infection or dental disease, you should see your dentist as soon as possible for further care.  SEEK MEDICAL CARE IF: The exam and treatment you received today has been provided on an emergency basis only. This is not a substitute for complete medical or dental care. If your problem worsens or new problems (symptoms) appear, and you are unable to meet with your dentist, call or return to this location. SEEK IMMEDIATE MEDICAL CARE IF:   You have a fever.   You develop redness and swelling of your face, jaw, or neck.   You are unable to open your mouth.   You have severe pain uncontrolled by pain medicine.  MAKE SURE YOU:   Understand these instructions.   Will watch your condition.   Will get help right away if you are not doing  well or get worse.  Document Released: 07/14/2005 Document Revised: 07/03/2011 Document Reviewed: 03/01/2008 [facility] Patient Information 2012 [place], [place].

## 2011-12-07 NOTE — ED Notes (Signed)
Pt in via ems with dental pain states left side upper/lower pain states no relief with advil
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
## 2012-01-14 ENCOUNTER — Emergency Department (HOSPITAL_COMMUNITY): Payer: Medicare Other

## 2012-01-14 ENCOUNTER — Encounter (HOSPITAL_COMMUNITY): Payer: Self-pay | Admitting: Emergency Medicine

## 2012-01-14 ENCOUNTER — Emergency Department (HOSPITAL_COMMUNITY)
Admission: EM | Admit: 2012-01-14 | Discharge: 2012-01-14 | Disposition: A | Discharge status: Home / Self Care | Payer: Medicare Other | Attending: Emergency Medicine | Admitting: Emergency Medicine

## 2012-01-14 DIAGNOSIS — M549 Dorsalgia, unspecified: Secondary | ICD-10-CM | POA: Insufficient documentation

## 2012-01-14 DIAGNOSIS — S335XXA Sprain of ligaments of lumbar spine, initial encounter: Secondary | ICD-10-CM | POA: Insufficient documentation

## 2012-01-14 DIAGNOSIS — S8390XA Sprain of unspecified site of unspecified knee, initial encounter: Secondary | ICD-10-CM

## 2012-01-14 DIAGNOSIS — W19XXXA Unspecified fall, initial encounter: Secondary | ICD-10-CM

## 2012-01-14 DIAGNOSIS — Y9229 Other specified public building as the place of occurrence of the external cause: Secondary | ICD-10-CM | POA: Insufficient documentation

## 2012-01-14 DIAGNOSIS — W010XXA Fall on same level from slipping, tripping and stumbling without subsequent striking against object, initial encounter: Secondary | ICD-10-CM | POA: Insufficient documentation

## 2012-01-14 DIAGNOSIS — R55 Syncope and collapse: Secondary | ICD-10-CM | POA: Insufficient documentation

## 2012-01-14 DIAGNOSIS — M542 Cervicalgia: Secondary | ICD-10-CM | POA: Insufficient documentation

## 2012-01-14 DIAGNOSIS — IMO0002 Reserved for concepts with insufficient information to code with codable children: Secondary | ICD-10-CM | POA: Insufficient documentation

## 2012-01-14 DIAGNOSIS — S39012A Strain of muscle, fascia and tendon of lower back, initial encounter: Secondary | ICD-10-CM

## 2012-01-14 MED ORDER — LORAZEPAM 2 MG/ML IJ SOLN
1.0000 mg | Freq: Once | INTRAMUSCULAR | Status: AC
  Administered 2012-01-14: 1 mg via INTRAVENOUS
  Filled 2012-01-14: qty 1

## 2012-01-14 MED ORDER — OXYCODONE-ACETAMINOPHEN 5-325 MG PO TABS: 1.0000 | ORAL_TABLET | Freq: Four times a day (QID) | ORAL | Status: AC | PRN

## 2012-01-14 MED ORDER — OXYCODONE-ACETAMINOPHEN 5-325 MG PO TABS
2.0000 | ORAL_TABLET | Freq: Once | ORAL | Status: AC
  Administered 2012-01-14: 2 via ORAL
  Filled 2012-01-14: qty 2

## 2012-01-14 NOTE — ED Notes (Addendum)
Pt AAOx4, drowsy. Attempts to start IVs. IV Team called, no answer. Charge RN IV successfully placed IV.

## 2012-01-14 NOTE — ED Notes (Signed)
[id number]<QI> Expected date:01/14/12<BR> Expected time:<BR> Means of arrival:<BR> Comments:<BR> EMS 61 GC - fall/pain

## 2012-01-14 NOTE — ED Notes (Signed)
Late entry: earlier in visit security was called to bedside due to patient screaming at staff and cursing at staff, supportive care and calm environment provided, along with limit setting.

## 2012-01-14 NOTE — ED Notes (Addendum)
IV was removed by Pt. IV site taped and gauzed. Pt and son given bus passes.

## 2012-01-14 NOTE — Discharge Instructions (Signed)
Back Pain, Adult Low back pain is very common. About 1 in 5 people have back pain.The cause of low back pain is rarely dangerous. The pain often gets better over time.About half of people with a sudden onset of back pain feel better in just 2 weeks. About 8 in 10 people feel better by 6 weeks.  CAUSES Some common causes of back pain include:  Strain of the muscles or ligaments supporting the spine.   Wear and tear (degeneration) of the spinal discs.   Arthritis.   Direct injury to the back.  DIAGNOSIS Most of the time, the direct cause of low back pain is not known.However, back pain can be treated effectively even when the exact cause of the pain is unknown.Answering your caregiver's questions about your overall health and symptoms is one of the most accurate ways to make sure the cause of your pain is not dangerous. If your caregiver needs more information, he or she may order lab work or imaging tests (X-rays or MRIs).However, even if imaging tests show changes in your back, this usually does not require surgery. HOME CARE INSTRUCTIONS For many people, back pain returns.Since low back pain is rarely dangerous, it is often a condition that people can learn to manageon their own.   Remain active. It is stressful on the back to sit or stand in one place. Do not sit, drive, or stand in one place for more than 30 minutes at a time. Take short walks on level surfaces as soon as pain allows.Try to increase the length of time you walk each day.   Do not stay in bed.Resting more than 1 or 2 days can delay your recovery.   Do not avoid exercise or work.Your body is made to move.It is not dangerous to be active, even though your back may hurt.Your back will likely heal faster if you return to being active before your pain is gone.   Pay attention to your body when you bend and lift. Many people have less discomfortwhen lifting if they bend their knees, keep the load close to their  bodies,and avoid twisting. Often, the most comfortable positions are those that put less stress on your recovering back.   Find a comfortable position to sleep. Use a firm mattress and lie on your side with your knees slightly bent. If you lie on your back, put a pillow under your knees.   Only take over-the-counter or prescription medicines as directed by your caregiver. Over-the-counter medicines to reduce pain and inflammation are often the most helpful.Your caregiver may prescribe muscle relaxant drugs.These medicines help dull your pain so you can more quickly return to your normal activities and healthy exercise.   Put ice on the injured area.   Put ice in a plastic bag.   Place a towel between your skin and the bag.   Leave the ice on for 15 to 20 minutes, 3 to 4 times a day for the first 2 to 3 days. After that, ice and heat may be alternated to reduce pain and spasms.   Ask your caregiver about trying back exercises and gentle massage. This may be of some benefit.   Avoid feeling anxious or stressed.Stress increases muscle tension and can worsen back pain.It is important to recognize when you are anxious or stressed and learn ways to manage it.Exercise is a great option.  SEEK MEDICAL CARE IF:  You have pain that is not relieved with rest or medicine.   You have   pain that does not improve in 1 week.   You have new symptoms.   You are generally not feeling well.  SEEK IMMEDIATE MEDICAL CARE IF:   You have pain that radiates from your back into your legs.   You develop new bowel or bladder control problems.   You have unusual weakness or numbness in your arms or legs.   You develop nausea or vomiting.   You develop abdominal pain.   You feel faint.  Document Released: 07/14/2005 Document Revised: 07/03/2011 Document Reviewed: 12/02/2010 [employer] Patient Information 2012 [employer]. 
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
## 2012-01-14 NOTE — ED Notes (Addendum)
Pt had a seizure at xray. Code was called. Witnessing RN states the pt eyes deviated to the left. Airway was maintained. Seizure lasted 30 seconds.

## 2012-01-14 NOTE — ED Notes (Signed)
MD at bedside. Spine board removed.  
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
## 2012-02-15 ENCOUNTER — Emergency Department (HOSPITAL_COMMUNITY): Payer: Medicare Other

## 2012-02-15 ENCOUNTER — Inpatient Hospital Stay (HOSPITAL_COMMUNITY)
Admission: EM | Admit: 2012-02-15 | Discharge: 2012-02-18 | DRG: 075 | Disposition: A | Discharge status: Home / Self Care | Payer: Medicare Other | Attending: Internal Medicine | Admitting: Internal Medicine

## 2012-02-15 ENCOUNTER — Other Ambulatory Visit: Payer: Self-pay | Admitting: Diagnostic Radiology

## 2012-02-15 ENCOUNTER — Encounter (HOSPITAL_COMMUNITY): Payer: Self-pay | Admitting: *Deleted

## 2012-02-15 DIAGNOSIS — Z23 Encounter for immunization: Secondary | ICD-10-CM

## 2012-02-15 DIAGNOSIS — R519 Headache, unspecified: Secondary | ICD-10-CM | POA: Diagnosis present

## 2012-02-15 DIAGNOSIS — F319 Bipolar disorder, unspecified: Secondary | ICD-10-CM | POA: Diagnosis present

## 2012-02-15 DIAGNOSIS — F603 Borderline personality disorder: Secondary | ICD-10-CM | POA: Diagnosis present

## 2012-02-15 DIAGNOSIS — Z79899 Other long term (current) drug therapy: Secondary | ICD-10-CM

## 2012-02-15 DIAGNOSIS — IMO0002 Reserved for concepts with insufficient information to code with codable children: Secondary | ICD-10-CM | POA: Diagnosis present

## 2012-02-15 DIAGNOSIS — D72829 Elevated white blood cell count, unspecified: Secondary | ICD-10-CM | POA: Diagnosis present

## 2012-02-15 DIAGNOSIS — B003 Herpesviral meningitis: Principal | ICD-10-CM | POA: Diagnosis present

## 2012-02-15 DIAGNOSIS — F209 Schizophrenia, unspecified: Secondary | ICD-10-CM | POA: Diagnosis present

## 2012-02-15 DIAGNOSIS — G40909 Epilepsy, unspecified, not intractable, without status epilepticus: Secondary | ICD-10-CM | POA: Diagnosis present

## 2012-02-15 DIAGNOSIS — Z6841 Body Mass Index (BMI) 40.0 and over, adult: Secondary | ICD-10-CM

## 2012-02-15 DIAGNOSIS — F064 Anxiety disorder due to known physiological condition: Secondary | ICD-10-CM | POA: Diagnosis present

## 2012-02-15 DIAGNOSIS — A879 Viral meningitis, unspecified: Secondary | ICD-10-CM

## 2012-02-15 DIAGNOSIS — E669 Obesity, unspecified: Secondary | ICD-10-CM | POA: Diagnosis present

## 2012-02-15 DIAGNOSIS — R51 Headache: Secondary | ICD-10-CM | POA: Diagnosis present

## 2012-02-15 DIAGNOSIS — F17200 Nicotine dependence, unspecified, uncomplicated: Secondary | ICD-10-CM | POA: Diagnosis present

## 2012-02-15 HISTORY — DX: Viral meningitis, unspecified: A87.9

## 2012-02-15 LAB — URINE MICROSCOPIC-ADD ON

## 2012-02-15 LAB — CBC WITH DIFFERENTIAL/PLATELET
Basophils Relative: 0 % (ref 0–1)
Hemoglobin: 12.9 g/dL (ref 12.0–15.0)
MCHC: 34.3 g/dL (ref 30.0–36.0)
Monocytes Relative: 5 % (ref 3–12)
Neutro Abs: 8.3 10*3/uL — ABNORMAL HIGH (ref 1.7–7.7)
Neutrophils Relative %: 73 % (ref 43–77)
RBC: 4.27 MIL/uL (ref 3.87–5.11)

## 2012-02-15 LAB — CSF CELL COUNT WITH DIFFERENTIAL
Eosinophils, CSF: 1 % (ref 0–1)
Lymphs, CSF: 72 % (ref 40–80)
Monocyte-Macrophage-Spinal Fluid: 20 % (ref 15–45)
Segmented Neutrophils-CSF: 7 % — ABNORMAL HIGH (ref 0–6)
Tube #: 4
WBC, CSF: 729 /mm3 (ref 0–5)

## 2012-02-15 LAB — PROTEIN AND GLUCOSE, CSF: Total  Protein, CSF: 103 mg/dL — ABNORMAL HIGH (ref 15–45)

## 2012-02-15 LAB — URINALYSIS, ROUTINE W REFLEX MICROSCOPIC
Leukocytes, UA: NEGATIVE
Nitrite: NEGATIVE
Specific Gravity, Urine: 1.009 (ref 1.005–1.030)
Urobilinogen, UA: 0.2 mg/dL (ref 0.0–1.0)

## 2012-02-15 MED ORDER — METOCLOPRAMIDE HCL 5 MG/ML IJ SOLN
10.0000 mg | Freq: Once | INTRAMUSCULAR | Status: AC
  Administered 2012-02-15: 10 mg via INTRAVENOUS
  Filled 2012-02-15: qty 2

## 2012-02-15 MED ORDER — ACETAMINOPHEN 650 MG RE SUPP
650.0000 mg | Freq: Once | RECTAL | Status: DC
  Filled 2012-02-15: qty 1

## 2012-02-15 MED ORDER — FENTANYL CITRATE 0.05 MG/ML IJ SOLN
100.0000 ug | Freq: Once | INTRAMUSCULAR | Status: AC
  Administered 2012-02-15: 100 ug via INTRAVENOUS
  Filled 2012-02-15: qty 2

## 2012-02-15 MED ORDER — ONDANSETRON HCL 4 MG/2ML IJ SOLN
INTRAMUSCULAR | Status: AC
  Filled 2012-02-15: qty 2

## 2012-02-15 MED ORDER — DEXTROSE 5 % IV SOLN: 2.0000 g | Freq: Once | INTRAVENOUS | Status: DC

## 2012-02-15 MED ORDER — HYDROMORPHONE HCL PF 2 MG/ML IJ SOLN
INTRAMUSCULAR | Status: AC
  Filled 2012-02-15: qty 2

## 2012-02-15 MED ORDER — HYDROMORPHONE HCL PF 1 MG/ML IJ SOLN
INTRAMUSCULAR | Status: AC
  Administered 2012-02-16: 1 mg
  Filled 2012-02-15: qty 1

## 2012-02-15 MED ORDER — ONDANSETRON HCL 4 MG/2ML IJ SOLN
4.0000 mg | Freq: Once | INTRAMUSCULAR | Status: DC
  Filled 2012-02-15 (×2): qty 2

## 2012-02-15 MED ORDER — SODIUM CHLORIDE 0.9 % IV BOLUS (SEPSIS)
1000.0000 mL | Freq: Once | INTRAVENOUS | Status: AC
  Administered 2012-02-15: 1000 mL via INTRAVENOUS

## 2012-02-15 MED ORDER — DIPHENHYDRAMINE HCL 50 MG/ML IJ SOLN
25.0000 mg | Freq: Once | INTRAMUSCULAR | Status: AC
  Administered 2012-02-15: 25 mg via INTRAVENOUS
  Filled 2012-02-15: qty 1

## 2012-02-15 MED ORDER — LIDOCAINE HCL 2 % IJ SOLN
10.0000 mL | Freq: Once | INTRAMUSCULAR | Status: DC
  Filled 2012-02-15: qty 1

## 2012-02-15 MED ORDER — DEXAMETHASONE SODIUM PHOSPHATE 10 MG/ML IJ SOLN
10.0000 mg | Freq: Once | INTRAMUSCULAR | Status: AC
  Administered 2012-02-15: 10 mg via INTRAVENOUS
  Filled 2012-02-15: qty 1

## 2012-02-15 MED ORDER — HYDROMORPHONE HCL PF 1 MG/ML IJ SOLN: 1.0000 mg | Freq: Once | INTRAMUSCULAR | Status: DC

## 2012-02-15 MED ORDER — VANCOMYCIN HCL IN DEXTROSE 1-5 GM/200ML-% IV SOLN: 1000.0000 mg | Freq: Once | INTRAVENOUS | Status: DC

## 2012-02-15 MED ORDER — DIPHENHYDRAMINE HCL 50 MG/ML IJ SOLN
INTRAMUSCULAR | Status: AC
  Filled 2012-02-15: qty 1

## 2012-02-15 MED ORDER — CEFTRIAXONE SODIUM 1 G IJ SOLR: 1.0000 g | Freq: Once | INTRAMUSCULAR | Status: DC

## 2012-02-15 NOTE — ED Notes (Signed)
PA at bedside.

## 2012-02-15 NOTE — ED Notes (Signed)
Ambulated to bathroom with min. Assist.

## 2012-02-15 NOTE — Procedures (Signed)
.  Procedure: Lumbar puncture with fluoroscopic guidance. Specimen: CSF to lab Bleeding: minimal. Complications: None immediate. Patient   -Condition: Stable.  -Disposition:  Return to ED.  Full Radiology Report to Follow
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
## 2012-02-15 NOTE — ED Notes (Signed)
Pt. Transported to Radiology.
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
## 2012-02-16 ENCOUNTER — Encounter (HOSPITAL_COMMUNITY): Payer: Self-pay | Admitting: Nurse Practitioner

## 2012-02-16 DIAGNOSIS — D72829 Elevated white blood cell count, unspecified: Secondary | ICD-10-CM | POA: Diagnosis present

## 2012-02-16 DIAGNOSIS — F603 Borderline personality disorder: Secondary | ICD-10-CM | POA: Diagnosis present

## 2012-02-16 DIAGNOSIS — A879 Viral meningitis, unspecified: Secondary | ICD-10-CM

## 2012-02-16 DIAGNOSIS — F209 Schizophrenia, unspecified: Secondary | ICD-10-CM | POA: Diagnosis present

## 2012-02-16 DIAGNOSIS — G40909 Epilepsy, unspecified, not intractable, without status epilepticus: Secondary | ICD-10-CM | POA: Diagnosis present

## 2012-02-16 DIAGNOSIS — R51 Headache: Secondary | ICD-10-CM | POA: Diagnosis present

## 2012-02-16 DIAGNOSIS — IMO0002 Reserved for concepts with insufficient information to code with codable children: Secondary | ICD-10-CM | POA: Diagnosis present

## 2012-02-16 DIAGNOSIS — R519 Headache, unspecified: Secondary | ICD-10-CM | POA: Diagnosis present

## 2012-02-16 DIAGNOSIS — F319 Bipolar disorder, unspecified: Secondary | ICD-10-CM | POA: Diagnosis present

## 2012-02-16 HISTORY — DX: Viral meningitis, unspecified: A87.9

## 2012-02-16 LAB — COMPREHENSIVE METABOLIC PANEL
AST: 14 U/L (ref 0–37)
CO2: 22 mEq/L (ref 19–32)
Calcium: 9.5 mg/dL (ref 8.4–10.5)
Creatinine, Ser: 0.66 mg/dL (ref 0.50–1.10)
GFR calc Af Amer: >90 mL/min (ref >90)
GFR calc non Af Amer: >90 mL/min (ref >90)

## 2012-02-16 LAB — CBC
MCH: 30.1 pg (ref 26.0–34.0)
MCV: 87.5 fL (ref 78.0–100.0)
Platelets: 319 10*3/uL (ref 150–400)
RBC: 4.49 MIL/uL (ref 3.87–5.11)

## 2012-02-16 LAB — MRSA PCR SCREENING: MRSA by PCR: NEGATIVE

## 2012-02-16 LAB — HERPES SIMPLEX VIRUS(HSV) DNA BY PCR

## 2012-02-16 LAB — PATHOLOGIST SMEAR REVIEW
Tech Review: INCREASED
Tech Review: INCREASED

## 2012-02-16 MED ORDER — HYDROMORPHONE HCL PF 1 MG/ML IJ SOLN
1.0000 mg | INTRAMUSCULAR | Status: DC | PRN
  Administered 2012-02-16 – 2012-02-18 (×13): 1 mg via INTRAVENOUS
  Filled 2012-02-16 (×14): qty 1

## 2012-02-16 MED ORDER — VENLAFAXINE HCL ER 150 MG PO CP24
300.0000 mg | ORAL_CAPSULE | Freq: Every day | ORAL | Status: DC
  Administered 2012-02-16: 300 mg via ORAL
  Filled 2012-02-16 (×2): qty 2

## 2012-02-16 MED ORDER — ACETAMINOPHEN 325 MG PO TABS
650.0000 mg | ORAL_TABLET | Freq: Four times a day (QID) | ORAL | Status: DC | PRN
  Administered 2012-02-16 – 2012-02-18 (×4): 650 mg via ORAL
  Filled 2012-02-16: qty 2
  Filled 2012-02-16: qty 1
  Filled 2012-02-16 (×2): qty 2

## 2012-02-16 MED ORDER — NICOTINE 21 MG/24HR TD PT24
21.0000 mg | MEDICATED_PATCH | Freq: Every day | TRANSDERMAL | Status: DC
  Administered 2012-02-16 – 2012-02-18 (×3): 21 mg via TRANSDERMAL
  Filled 2012-02-16 (×3): qty 1

## 2012-02-16 MED ORDER — DEXTROSE 5 % IV SOLN
10.0000 mg/kg | Freq: Three times a day (TID) | INTRAVENOUS | Status: DC
  Administered 2012-02-16 – 2012-02-18 (×7): 455 mg via INTRAVENOUS
  Filled 2012-02-16 (×9): qty 9.1

## 2012-02-16 MED ORDER — SODIUM CHLORIDE 0.9 % IV SOLN
INTRAVENOUS | Status: DC
  Administered 2012-02-16 – 2012-02-17 (×3): via INTRAVENOUS

## 2012-02-16 MED ORDER — POLYETHYLENE GLYCOL 3350 17 G PO PACK
17.0000 g | PACK | Freq: Every day | ORAL | Status: DC | PRN
  Administered 2012-02-16: 17 g via ORAL
  Filled 2012-02-16 (×2): qty 1

## 2012-02-16 MED ORDER — ONDANSETRON HCL 4 MG PO TABS: 4.0000 mg | ORAL_TABLET | Freq: Four times a day (QID) | ORAL | Status: DC | PRN

## 2012-02-16 MED ORDER — VENLAFAXINE HCL ER 150 MG PO CP24: 300.0000 mg | ORAL_CAPSULE | Freq: Every day | ORAL | Status: DC

## 2012-02-16 MED ORDER — GABAPENTIN 600 MG PO TABS: 1200.0000 mg | ORAL_TABLET | Freq: Three times a day (TID) | ORAL | Status: DC

## 2012-02-16 MED ORDER — SENNOSIDES-DOCUSATE SODIUM 8.6-50 MG PO TABS
2.0000 | ORAL_TABLET | Freq: Two times a day (BID) | ORAL | Status: DC
  Administered 2012-02-17 – 2012-02-18 (×3): 2 via ORAL
  Filled 2012-02-16 (×2): qty 1
  Filled 2012-02-16 (×3): qty 2

## 2012-02-16 MED ORDER — PNEUMOCOCCAL VAC POLYVALENT 25 MCG/0.5ML IJ INJ
0.5000 mL | INJECTION | INTRAMUSCULAR | Status: AC
  Administered 2012-02-17: 0.5 mL via INTRAMUSCULAR
  Filled 2012-02-16: qty 0.5

## 2012-02-16 MED ORDER — HYDROXYZINE HCL 25 MG PO TABS
25.0000 mg | ORAL_TABLET | Freq: Four times a day (QID) | ORAL | Status: DC
  Administered 2012-02-16 – 2012-02-18 (×10): 25 mg via ORAL
  Filled 2012-02-16 (×16): qty 1

## 2012-02-16 MED ORDER — ONDANSETRON HCL 4 MG/2ML IJ SOLN
4.0000 mg | Freq: Four times a day (QID) | INTRAMUSCULAR | Status: DC | PRN
Start: 2012-02-16 — End: 2012-02-18

## 2012-02-16 MED ORDER — DEXTROSE 5 % IV SOLN
2.0000 g | Freq: Two times a day (BID) | INTRAVENOUS | Status: DC
  Filled 2012-02-16: qty 2

## 2012-02-16 MED ORDER — GABAPENTIN 600 MG PO TABS
1200.0000 mg | ORAL_TABLET | Freq: Three times a day (TID) | ORAL | Status: DC
  Administered 2012-02-16 – 2012-02-18 (×8): 1200 mg via ORAL
  Filled 2012-02-16 (×12): qty 2

## 2012-02-16 MED ORDER — VENLAFAXINE HCL ER 150 MG PO CP24
300.0000 mg | ORAL_CAPSULE | Freq: Every day | ORAL | Status: DC
  Administered 2012-02-17 – 2012-02-18 (×2): 300 mg via ORAL
  Filled 2012-02-16 (×2): qty 2
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
## 2012-02-16 NOTE — ED Provider Notes (Signed)
Medical screening examination/treatment/procedure(s) were performed by non-physician practitioner and as supervising physician I was immediately available for consultation/collaboration.  Kalandra Masters R. Maziah Smola, MD 02/16/12 1348 

## 2012-02-16 NOTE — Progress Notes (Addendum)
Order to transfer pt to med-surg.  Pt  Informed.  Report called to [facility] on 4N,pt to go to 28.  Belongings packed.  Pt to move in wheel chair.  Pt transferred at 1550hrs via WC without problem.

## 2012-02-17 DIAGNOSIS — D72829 Elevated white blood cell count, unspecified: Secondary | ICD-10-CM

## 2012-02-17 DIAGNOSIS — B003 Herpesviral meningitis: Secondary | ICD-10-CM | POA: Diagnosis present

## 2012-02-17 LAB — CBC
MCHC: 33.3 g/dL (ref 30.0–36.0)
Platelets: 285 10*3/uL (ref 150–400)
RDW: 14 % (ref 11.5–15.5)

## 2012-02-17 MED FILL — Hydromorphone HCl Preservative Free (PF) Inj 1 MG/ML: INTRAMUSCULAR | Qty: 1 | Status: AC

## 2012-02-17 NOTE — Clinical Documentation Improvement (Signed)
BMI DOCUMENTATION CLARIFICATION QUERY  THIS DOCUMENT IS NOT A PERMANENT PART OF THE MEDICAL RECORD  TO RESPOND TO THE THIS QUERY, FOLLOW THE INSTRUCTIONS BELOW:  1. If needed, update documentation for the patient's encounter via the notes activity.  2. Access this query again and click edit on the In [employer].  3. After updating, or not, click F2 to complete all highlighted (required) fields concerning your review. Select "additional documentation in the medical record" OR "no additional documentation provided".  4. Click Sign note button.  5. The deficiency will fall out of your In Basket *Please let us know if you are not able to complete this workflow by phone or e-mail (listed below).         02/17/12  Dear Dr. [facility]  In an effort to better capture your patient's severity of illness, reflect appropriate length of stay and utilization of resources, a review of the patient medical record has revealed the following indicators.    Based on your clinical judgment, please clarify and document in a progress note and/or discharge summary the clinical condition associated with the following supporting information:  In responding to this query please exercise your independent judgment.  The fact that a query is asked, does not imply that any particular answer is desired or expected.  Possible Clinical conditions  Morbid Obesity W/ BMI=   Other condition  Cannot Clinically determine   Biometrics: Weight: 228lb Height   5Ft BMI=     44.7 per CHL  Women Men  Underweight < 19    <20     Acceptable 19 - 25    20 - 25   Overweight 25 - 30    25 - 30  Obese 30 - 40    30 - 40   Morbidly Obese > 40    > 40    [email or web address]es     Reviewed: Query not answered  Thank You,   Rossie Muskrat RN, BSN  Clinical Documentation Specialist Pager:  [phone]  [email or web address]  Health Information  Management [place]
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
## 2012-02-18 MED ORDER — VALACYCLOVIR HCL 1 G PO TABS: 1000.0000 mg | ORAL_TABLET | Freq: Three times a day (TID) | ORAL | Status: AC

## 2012-02-18 MED ORDER — HYDROCODONE-ACETAMINOPHEN 5-500 MG PO TABS: 1.0000 | ORAL_TABLET | ORAL | Status: AC | PRN

## 2012-02-18 MED ORDER — VALACYCLOVIR HCL 500 MG PO TABS
1000.0000 mg | ORAL_TABLET | Freq: Three times a day (TID) | ORAL | Status: DC
  Filled 2012-02-18 (×2): qty 2

## 2012-02-18 MED ORDER — ACYCLOVIR 800 MG PO TABS: 1000.0000 mg | ORAL_TABLET | Freq: Three times a day (TID) | ORAL | Status: DC
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
## 2012-02-18 NOTE — Progress Notes (Signed)
Pt. Given discharge instructions, and RX.  No questions voiced, anxious to go home.

## 2012-02-18 NOTE — Clinical Social Work Note (Signed)
Pt discharged prior to CSW follow up. No further needs identified. CSW signing off.   Dede Query, MSW, [facility] [phone]

## 2012-02-18 NOTE — Progress Notes (Signed)
Pt called stated she was ready to go. Explained that doctors hadn't put in discharge orders yet.  Stated she had to go, and that she is leaving in .
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
## 2012-02-18 NOTE — Progress Notes (Signed)
Pt woke for meds, after receiving scheduled meds, pt cheerful, and requested pain med.  States her head hurts and rates pain at 8. Will medicate with IV Dilaudid when due.

## 2012-02-18 NOTE — Care Management Note (Signed)
    Page 1 of 1   02/18/2012     3:56:33 PM   CARE MANAGEMENT NOTE 02/18/2012  Patient:  Jasmine Fisher, Jasmine Fisher   Account Number:  [id number]  Date Initiated:  02/16/2012  Documentation initiated by:  Avie Arenas  Subjective/Objective Assessment:   Meningitis -  Lives with children.     Action/Plan:   Anticipated DC Date:  02/20/2012   Anticipated DC Plan:  HOME W HOME HEALTH SERVICES      DC Planning Services  CM consult      Choice offered to / List presented to:             Status of service:  Completed, signed off Medicare Important Message given?   (If response is "NO", the following Medicare IM given date fields will be blank) Date Medicare IM given:   Date Additional Medicare IM given:    Discharge Disposition:  HOME/SELF CARE  Per UR Regulation:  Reviewed for med. necessity/level of care/duration of stay  If discussed at Long Length of Stay Meetings, dates discussed:    Comments:  Contact:  Swaringer,Shantay Sister [phone]

## 2012-03-08 LAB — ARBOVIRUS PANEL, ~~LOC~~ LAB

## 2022-03-15 ENCOUNTER — Encounter (HOSPITAL_COMMUNITY): Payer: Self-pay

## 2022-03-15 ENCOUNTER — Ambulatory Visit (HOSPITAL_COMMUNITY)
Admission: EM | Admit: 2022-03-15 | Discharge: 2022-03-15 | Disposition: A | Discharge status: Home / Self Care | Payer: Medicare Other

## 2022-03-15 DIAGNOSIS — S0093XS Contusion of unspecified part of head, sequela: Secondary | ICD-10-CM

## 2022-03-15 DIAGNOSIS — K047 Periapical abscess without sinus: Secondary | ICD-10-CM | POA: Diagnosis not present

## 2022-03-15 MED ORDER — KETOROLAC TROMETHAMINE 30 MG/ML IJ SOLN
30.0000 mg | Freq: Once | INTRAMUSCULAR | Status: AC
  Administered 2022-03-15: 30 mg via INTRAMUSCULAR

## 2022-03-15 MED ORDER — KETOROLAC TROMETHAMINE 30 MG/ML IJ SOLN
INTRAMUSCULAR | Status: AC
  Filled 2022-03-15: qty 1

## 2022-03-15 MED ORDER — IBUPROFEN 600 MG PO TABS: 600.0000 mg | ORAL_TABLET | Freq: Three times a day (TID) | ORAL | 0 refills | Status: AC | PRN

## 2022-03-15 MED ORDER — CLINDAMYCIN HCL 150 MG PO CAPS: 450.0000 mg | ORAL_CAPSULE | Freq: Three times a day (TID) | ORAL | 0 refills | Status: AC
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
## 2022-03-15 NOTE — Discharge Instructions (Signed)
During your visit today, you received an injection of ketorolac which is a strong nonsteroidal anti-inflammatory pain medication which should reduce your pain significantly.  I have also provided you with a prescription for ibuprofen 600 mg that you can take every 8 hours as needed for pain.  For your dental infection, please begin an antibiotic called clindamycin.  Please take 3 tablets 3 times daily for the next 10 days.  I provided you with a list of low-cost community dental resources that you should reach out to soon as possible to schedule an appointment to have your teeth evaluated and treated to prevent further dental infections.  If you continue to have a headache despite beginning antibiotics over the next few days, recommend that you go to the emergency room for further evaluation.  You may require CT scan of your head which we cannot order here at urgent care.  Please go to [facility] emergency room if needed.  Thank you for visiting urgent care today.

## 2022-03-17 ENCOUNTER — Encounter (HOSPITAL_COMMUNITY): Payer: Self-pay

## 2022-03-17 ENCOUNTER — Ambulatory Visit (HOSPITAL_COMMUNITY)
Admission: EM | Admit: 2022-03-17 | Discharge: 2022-03-17 | Disposition: A | Discharge status: Home / Self Care | Payer: Medicare Other | Attending: Family Medicine | Admitting: Family Medicine

## 2022-03-17 DIAGNOSIS — F319 Bipolar disorder, unspecified: Secondary | ICD-10-CM | POA: Diagnosis present

## 2022-03-17 DIAGNOSIS — F200 Paranoid schizophrenia: Secondary | ICD-10-CM | POA: Insufficient documentation

## 2022-03-17 DIAGNOSIS — Z760 Encounter for issue of repeat prescription: Secondary | ICD-10-CM | POA: Insufficient documentation

## 2022-03-17 DIAGNOSIS — Z79899 Other long term (current) drug therapy: Secondary | ICD-10-CM | POA: Diagnosis present

## 2022-03-17 LAB — COMPREHENSIVE METABOLIC PANEL
ALT: 20 U/L (ref 0–44)
AST: 17 U/L (ref 15–41)
Albumin: 3.6 g/dL (ref 3.5–5.0)
Alkaline Phosphatase: 78 U/L (ref 38–126)
Anion gap: 5 (ref 5–15)
BUN: 15 mg/dL (ref 6–20)
CO2: 23 mmol/L (ref 22–32)
Calcium: 9.1 mg/dL (ref 8.9–10.3)
Chloride: 110 mmol/L (ref 98–111)
Creatinine, Ser: 0.73 mg/dL (ref 0.44–1.00)
GFR, Estimated: >60 mL/min (ref >60)
Glucose, Bld: 95 mg/dL (ref 70–99)
Potassium: 3.7 mmol/L (ref 3.5–5.1)
Sodium: 138 mmol/L (ref 135–145)
Total Bilirubin: 0.5 mg/dL (ref 0.3–1.2)
Total Protein: 7.1 g/dL (ref 6.5–8.1)

## 2022-03-17 LAB — CBC WITH DIFFERENTIAL/PLATELET
Abs Immature Granulocytes: 0.03 10*3/uL (ref 0.00–0.07)
Basophils Absolute: 0.1 10*3/uL (ref 0.0–0.1)
Basophils Relative: 1 %
Eosinophils Absolute: 0.2 10*3/uL (ref 0.0–0.5)
Eosinophils Relative: 2 %
HCT: 36.6 % (ref 36.0–46.0)
Hemoglobin: 12.2 g/dL (ref 12.0–15.0)
Immature Granulocytes: 0 %
Lymphocytes Relative: 29 %
Lymphs Abs: 2.7 10*3/uL (ref 0.7–4.0)
MCH: 30.7 pg (ref 26.0–34.0)
MCHC: 33.3 g/dL (ref 30.0–36.0)
MCV: 92.2 fL (ref 80.0–100.0)
Monocytes Absolute: 0.7 10*3/uL (ref 0.1–1.0)
Monocytes Relative: 8 %
Neutro Abs: 5.9 10*3/uL (ref 1.7–7.7)
Neutrophils Relative %: 60 %
Platelets: 422 10*3/uL — ABNORMAL HIGH (ref 150–400)
RBC: 3.97 MIL/uL (ref 3.87–5.11)
RDW: 13.2 % (ref 11.5–15.5)
WBC: 9.6 10*3/uL (ref 4.0–10.5)
nRBC: 0 % (ref 0.0–0.2)

## 2022-03-17 LAB — HEMOGLOBIN A1C
Hgb A1c MFr Bld: 5.8 % — ABNORMAL HIGH (ref 4.8–5.6)
Mean Plasma Glucose: 119.76 mg/dL

## 2022-03-17 MED ORDER — CARIPRAZINE HCL 4.5 MG PO CAPS: 4.5000 mg | ORAL_CAPSULE | Freq: Every day | ORAL | 0 refills | Status: DC

## 2022-03-17 MED ORDER — GABAPENTIN 400 MG PO TABS: 1200.0000 mg | ORAL_TABLET | Freq: Two times a day (BID) | ORAL | 0 refills | Status: DC

## 2022-03-17 MED ORDER — HYDROXYZINE HCL 25 MG PO TABS: 25.0000 mg | ORAL_TABLET | Freq: Four times a day (QID) | ORAL | 0 refills | Status: DC | PRN

## 2022-03-17 MED ORDER — VENLAFAXINE HCL ER 150 MG PO CP24: 300.0000 mg | ORAL_CAPSULE | Freq: Every day | ORAL | 0 refills | Status: DC

## 2022-03-17 MED ORDER — TOPIRAMATE 100 MG PO TABS: ORAL_TABLET | ORAL | 0 refills | Status: DC

## 2022-03-17 NOTE — ED Triage Notes (Signed)
Pt states she recently moved to the area and is nearing the end of her prescription medications. Pt requests med refills as well as information on [facility]
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
## 2022-03-17 NOTE — Discharge Instructions (Signed)
I have called in 1 month refills of all of your medication.  It is very important that you follow-up with behavioral health urgent care for further evaluation and management.  If you have any thoughts of wanting to hurt yourself or any worsening symptoms you need to go to the emergency room immediately.

## 2022-04-07 ENCOUNTER — Ambulatory Visit (HOSPITAL_BASED_OUTPATIENT_CLINIC_OR_DEPARTMENT_OTHER): Payer: Medicare Other | Admitting: Psychiatry

## 2022-04-07 ENCOUNTER — Encounter (HOSPITAL_COMMUNITY): Payer: Self-pay | Admitting: Psychiatry

## 2022-04-07 DIAGNOSIS — Z79899 Other long term (current) drug therapy: Secondary | ICD-10-CM | POA: Insufficient documentation

## 2022-04-07 DIAGNOSIS — F205 Residual schizophrenia: Secondary | ICD-10-CM

## 2022-04-07 DIAGNOSIS — S0990XS Unspecified injury of head, sequela: Secondary | ICD-10-CM | POA: Diagnosis not present

## 2022-04-07 DIAGNOSIS — F603 Borderline personality disorder: Secondary | ICD-10-CM

## 2022-04-07 MED ORDER — CARIPRAZINE HCL 4.5 MG PO CAPS: 4.5000 mg | ORAL_CAPSULE | Freq: Every day | ORAL | 1 refills | Status: DC

## 2022-04-07 MED ORDER — TOPIRAMATE 100 MG PO TABS: ORAL_TABLET | ORAL | 1 refills | Status: DC

## 2022-04-07 MED ORDER — VENLAFAXINE HCL ER 150 MG PO CP24: 150.0000 mg | ORAL_CAPSULE | Freq: Every day | ORAL | 1 refills | Status: DC

## 2022-04-07 MED ORDER — VENLAFAXINE HCL ER 75 MG PO CP24: 75.0000 mg | ORAL_CAPSULE | Freq: Every day | ORAL | 1 refills | Status: DC

## 2022-04-07 MED ORDER — GABAPENTIN 600 MG PO TABS: 1200.0000 mg | ORAL_TABLET | Freq: Two times a day (BID) | ORAL | 1 refills | Status: DC
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
## 2022-04-08 ENCOUNTER — Emergency Department (HOSPITAL_BASED_OUTPATIENT_CLINIC_OR_DEPARTMENT_OTHER)
Admission: EM | Admit: 2022-04-08 | Discharge: 2022-04-08 | Disposition: A | Discharge status: Home / Self Care | Payer: Medicare Other | Attending: Emergency Medicine | Admitting: Emergency Medicine

## 2022-04-08 ENCOUNTER — Other Ambulatory Visit: Payer: Self-pay

## 2022-04-08 ENCOUNTER — Encounter (HOSPITAL_BASED_OUTPATIENT_CLINIC_OR_DEPARTMENT_OTHER): Payer: Self-pay | Admitting: Obstetrics and Gynecology

## 2022-04-08 DIAGNOSIS — H9191 Unspecified hearing loss, right ear: Secondary | ICD-10-CM | POA: Insufficient documentation

## 2022-04-08 DIAGNOSIS — H538 Other visual disturbances: Secondary | ICD-10-CM | POA: Insufficient documentation

## 2022-04-08 DIAGNOSIS — F0781 Postconcussional syndrome: Secondary | ICD-10-CM

## 2022-04-08 DIAGNOSIS — R519 Headache, unspecified: Secondary | ICD-10-CM | POA: Insufficient documentation

## 2022-04-08 DIAGNOSIS — M5481 Occipital neuralgia: Secondary | ICD-10-CM

## 2022-04-08 DIAGNOSIS — Z87898 Personal history of other specified conditions: Secondary | ICD-10-CM

## 2022-04-08 MED ORDER — DEXAMETHASONE SODIUM PHOSPHATE 4 MG/ML IJ SOLN
4.0000 mg | Freq: Once | INTRAMUSCULAR | Status: AC
  Administered 2022-04-08: 4 mg
  Filled 2022-04-08: qty 1

## 2022-04-08 MED ORDER — BUPIVACAINE HCL 0.5 % IJ SOLN
10.0000 mL | Freq: Once | INTRAMUSCULAR | Status: AC
  Administered 2022-04-08: 10 mL
  Filled 2022-04-08: qty 1

## 2022-04-08 NOTE — Discharge Instructions (Signed)
Get help right away if: You have trouble breathing. You have chest pain. You have uncontrolled pain in the area that should be numbed. You have symptoms of a reaction to the nerve block. Symptoms of a reaction include: Numb lips. Changes to vision or hearing, such as blurred vision or ringing in your ears. A metallic taste in your mouth. Increased anxiety. Dizziness. Shaking (such as tremors), twitching, or seizures. Changes in bowel or bladder control.

## 2022-04-08 NOTE — ED Notes (Signed)
Discharge instructions and follow up care reviewed and explained, pt verbalized understanding and had no further questions on d/c. Pt caox4 and ambulatory on d/c.  
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
## 2022-05-05 ENCOUNTER — Other Ambulatory Visit (HOSPITAL_COMMUNITY): Payer: Self-pay | Admitting: Psychiatry

## 2022-05-07 ENCOUNTER — Ambulatory Visit: Payer: Medicare Other | Admitting: Neurology

## 2022-05-07 ENCOUNTER — Ambulatory Visit (HOSPITAL_COMMUNITY): Payer: Medicare Other | Admitting: Psychiatry

## 2022-05-12 ENCOUNTER — Ambulatory Visit (HOSPITAL_BASED_OUTPATIENT_CLINIC_OR_DEPARTMENT_OTHER): Payer: Medicare Other | Admitting: Psychiatry

## 2022-05-12 ENCOUNTER — Encounter (HOSPITAL_COMMUNITY): Payer: Self-pay | Admitting: Psychiatry

## 2022-05-12 VITALS — BP 113/78 | HR 98 | Temp 98.7°F | Ht 60.0 in | Wt 232.0 lb

## 2022-05-12 DIAGNOSIS — F603 Borderline personality disorder: Secondary | ICD-10-CM | POA: Diagnosis not present

## 2022-05-12 DIAGNOSIS — F205 Residual schizophrenia: Secondary | ICD-10-CM | POA: Diagnosis not present

## 2022-05-12 DIAGNOSIS — Z79899 Other long term (current) drug therapy: Secondary | ICD-10-CM | POA: Diagnosis not present
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
## 2022-05-27 ENCOUNTER — Ambulatory Visit: Payer: Medicare Other | Admitting: Neurology

## 2022-05-27 ENCOUNTER — Encounter: Payer: Self-pay | Admitting: Neurology

## 2022-06-16 ENCOUNTER — Encounter (HOSPITAL_COMMUNITY): Payer: Self-pay | Admitting: Psychiatry

## 2022-06-16 ENCOUNTER — Ambulatory Visit (HOSPITAL_BASED_OUTPATIENT_CLINIC_OR_DEPARTMENT_OTHER): Payer: Medicare Other | Admitting: Psychiatry

## 2022-06-16 VITALS — BP 125/82 | HR 82 | Temp 98.8°F | Ht 62.0 in | Wt 237.0 lb

## 2022-06-16 DIAGNOSIS — F205 Residual schizophrenia: Secondary | ICD-10-CM

## 2022-06-16 DIAGNOSIS — Z79899 Other long term (current) drug therapy: Secondary | ICD-10-CM

## 2022-06-16 DIAGNOSIS — F603 Borderline personality disorder: Secondary | ICD-10-CM | POA: Diagnosis not present

## 2022-06-16 MED ORDER — GABAPENTIN 600 MG PO TABS: 1200.0000 mg | ORAL_TABLET | Freq: Two times a day (BID) | ORAL | 2 refills | Status: DC

## 2022-06-16 MED ORDER — CARIPRAZINE HCL 4.5 MG PO CAPS: 4.5000 mg | ORAL_CAPSULE | Freq: Every day | ORAL | 2 refills | Status: DC
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
## 2022-07-16 ENCOUNTER — Ambulatory Visit (HOSPITAL_COMMUNITY): Payer: Medicare Other | Admitting: Psychiatry

## 2022-07-17 ENCOUNTER — Ambulatory Visit (INDEPENDENT_AMBULATORY_CARE_PROVIDER_SITE_OTHER): Payer: Medicare Other | Admitting: Licensed Clinical Social Worker

## 2022-07-17 DIAGNOSIS — F1420 Cocaine dependence, uncomplicated: Secondary | ICD-10-CM

## 2022-07-17 DIAGNOSIS — F1510 Other stimulant abuse, uncomplicated: Secondary | ICD-10-CM
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
## 2022-07-21 ENCOUNTER — Encounter (HOSPITAL_COMMUNITY): Payer: Self-pay

## 2022-07-21 ENCOUNTER — Emergency Department (HOSPITAL_COMMUNITY)
Admission: EM | Admit: 2022-07-21 | Discharge: 2022-07-21 | Disposition: A | Discharge status: Home / Self Care | Payer: Medicare Other | Attending: Emergency Medicine | Admitting: Emergency Medicine

## 2022-07-21 DIAGNOSIS — Z760 Encounter for issue of repeat prescription: Secondary | ICD-10-CM | POA: Diagnosis not present

## 2022-07-21 MED ORDER — CARIPRAZINE HCL 3 MG PO CAPS
4.5000 mg | ORAL_CAPSULE | Freq: Every day | ORAL | Status: DC
  Filled 2022-07-21: qty 1

## 2022-07-21 MED ORDER — CARIPRAZINE HCL 1.5 MG PO CAPS
4.5000 mg | ORAL_CAPSULE | Freq: Every day | ORAL | Status: DC
  Administered 2022-07-21: 4.5 mg via ORAL
  Filled 2022-07-21: qty 3

## 2022-07-21 NOTE — ED Provider Triage Note (Signed)
Emergency Medicine Provider Triage Evaluation Note  Jasmine Fisher , a 46 y.o. female  was evaluated in triage.  Pt is here because she missed 1 dose of Vraylar that she takes daily.  Patient states she missed 1 dose in the past and she started to have visual and auditory hallucinations.  She denies having any symptoms today.  She states she will be able to pick up her prescription tomorrow at [facility] on [address]  Review of Systems  Positive: As above Negative: As above  Physical Exam  BP 118/85 (BP Location: Left Arm)   Pulse 94   Temp 98.3 F (36.8 C) (Oral)   Resp 15   SpO2 100%  Gen:   Awake, no distress   Resp:  Normal effort  MSK:   Moves extremities without difficulty  Other:    Medical Decision Making  Medically screening exam initiated at 5:49 PM.  Appropriate orders placed.  Everli Rother was informed that the remainder of the evaluation will be completed by another provider, this initial triage assessment does not replace that evaluation, and the importance of remaining in the ED until their evaluation is complete.     Jeanelle Malling, [place] 07/22/22 2144

## 2022-07-21 NOTE — ED Triage Notes (Signed)
PT reports she took her last dose of Vraylar yesterday. PT states the pharmacy could not refill her prescription until tomorrow and wanted to see if she could get a dose here today. Otherwise, patient denies any complaints.
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
## 2022-07-21 NOTE — Discharge Instructions (Addendum)
Please pick up your medications tomorrow. Follow up with your PCP for further evaluation and management.

## 2022-07-22 ENCOUNTER — Ambulatory Visit (HOSPITAL_COMMUNITY): Payer: Medicare Other | Admitting: Licensed Clinical Social Worker

## 2022-07-29 ENCOUNTER — Encounter (HOSPITAL_COMMUNITY): Payer: Self-pay | Admitting: Licensed Clinical Social Worker

## 2022-07-29 ENCOUNTER — Ambulatory Visit (HOSPITAL_COMMUNITY): Payer: Medicare Other | Admitting: Licensed Clinical Social Worker

## 2022-08-16 ENCOUNTER — Other Ambulatory Visit: Payer: Self-pay

## 2022-08-16 ENCOUNTER — Ambulatory Visit (HOSPITAL_COMMUNITY)
Admission: EM | Admit: 2022-08-16 | Discharge: 2022-08-16 | Disposition: A | Discharge status: Home / Self Care | Payer: Medicare Other | Attending: Internal Medicine | Admitting: Internal Medicine

## 2022-08-16 ENCOUNTER — Encounter (HOSPITAL_COMMUNITY): Payer: Self-pay | Admitting: *Deleted

## 2022-08-16 DIAGNOSIS — N760 Acute vaginitis: Secondary | ICD-10-CM | POA: Diagnosis present

## 2022-08-16 DIAGNOSIS — R102 Pelvic and perineal pain: Secondary | ICD-10-CM | POA: Insufficient documentation

## 2022-08-16 DIAGNOSIS — Z3202 Encounter for pregnancy test, result negative: Secondary | ICD-10-CM | POA: Diagnosis present

## 2022-08-16 LAB — POCT URINALYSIS DIPSTICK, ED / UC
Bilirubin Urine: NEGATIVE
Glucose, UA: NEGATIVE mg/dL
Hgb urine dipstick: NEGATIVE
Ketones, ur: NEGATIVE mg/dL
Leukocytes,Ua: NEGATIVE
Nitrite: NEGATIVE
Protein, ur: NEGATIVE mg/dL
Specific Gravity, Urine: 1.015 (ref 1.005–1.030)
Urobilinogen, UA: 0.2 mg/dL (ref 0.0–1.0)
pH: 6 (ref 5.0–8.0)

## 2022-08-16 LAB — POC URINE PREG, ED: Preg Test, Ur: NEGATIVE

## 2022-08-16 MED ORDER — CEFTRIAXONE SODIUM 500 MG IJ SOLR
500.0000 mg | INTRAMUSCULAR | Status: DC
  Administered 2022-08-16: 500 mg via INTRAMUSCULAR

## 2022-08-16 MED ORDER — IBUPROFEN 800 MG PO TABS
800.0000 mg | ORAL_TABLET | Freq: Once | ORAL | Status: AC
  Administered 2022-08-16: 800 mg via ORAL

## 2022-08-16 MED ORDER — IBUPROFEN 800 MG PO TABS
ORAL_TABLET | ORAL | Status: AC
  Filled 2022-08-16: qty 1

## 2022-08-16 MED ORDER — CEFTRIAXONE SODIUM 500 MG IJ SOLR
INTRAMUSCULAR | Status: AC
  Filled 2022-08-16: qty 500

## 2022-08-16 NOTE — ED Triage Notes (Signed)
Pt reports she had pain with intercourse 5 days ago. PT has bleeding inside . Pt saw blood on Husbands penis . Pt reports ABD pain with odor.

## 2022-08-16 NOTE — Discharge Instructions (Signed)
You were tested today for STDs and the results are still pending; you have been given treatment for possible infections presumptively anyway due to your symptoms. You will receive a phone call in approximately 3 days if the results are positive. You should follow up with your primary care provider for further STI testing.  Avoid sexual intercourse for 7 days. Advise your sexual partner(s) to be evaluated, tested and treated. This includes all sexual partners within the past 60 days or your last sexual partner if last contact was greater than 60 days.  To minimize the risk of reinfection, you should abstain from sexual intercourse until your sexual partners have been tested and treated.   Consistent condom use is important in preventing the spread of sexually transmitted infections.  We will treat for any other positive results when your labs come back. If you do not hear from us, this means that your STI testing was negative or there is no change to your treatment plan. You will also receive these results via MyChart.   Return if you experience fevers 100.4 or greater, worsening or uncontrolled pain, rashes, sores, vomiting, or for any other concerning symptoms.   
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
## 2022-08-18 LAB — CERVICOVAGINAL ANCILLARY ONLY
Bacterial Vaginitis (gardnerella): POSITIVE — AB
Candida Glabrata: NEGATIVE
Candida Vaginitis: POSITIVE — AB
Chlamydia: NEGATIVE
Comment: NEGATIVE
Comment: NEGATIVE
Comment: NEGATIVE
Comment: NEGATIVE
Comment: NEGATIVE
Comment: NORMAL
Neisseria Gonorrhea: POSITIVE — AB
Trichomonas: POSITIVE — AB

## 2022-08-19 ENCOUNTER — Telehealth (HOSPITAL_COMMUNITY): Payer: Self-pay | Admitting: Emergency Medicine

## 2022-08-19 MED ORDER — FLUCONAZOLE 150 MG PO TABS: 150.0000 mg | ORAL_TABLET | Freq: Once | ORAL | 0 refills | Status: AC

## 2022-08-19 MED ORDER — METRONIDAZOLE 500 MG PO TABS: 500.0000 mg | ORAL_TABLET | Freq: Two times a day (BID) | ORAL | 0 refills | Status: AC

## 2022-08-27 ENCOUNTER — Encounter (HOSPITAL_COMMUNITY): Payer: Self-pay

## 2022-08-27 ENCOUNTER — Emergency Department (HOSPITAL_COMMUNITY): Payer: Medicare Other

## 2022-08-27 ENCOUNTER — Other Ambulatory Visit: Payer: Self-pay

## 2022-08-27 ENCOUNTER — Emergency Department (HOSPITAL_COMMUNITY)
Admission: EM | Admit: 2022-08-27 | Discharge: 2022-08-27 | Disposition: A | Discharge status: Home / Self Care | Payer: Medicare Other | Attending: Emergency Medicine | Admitting: Emergency Medicine

## 2022-08-27 DIAGNOSIS — J069 Acute upper respiratory infection, unspecified: Secondary | ICD-10-CM | POA: Insufficient documentation

## 2022-08-27 DIAGNOSIS — D72829 Elevated white blood cell count, unspecified: Secondary | ICD-10-CM | POA: Diagnosis not present

## 2022-08-27 DIAGNOSIS — Z20822 Contact with and (suspected) exposure to covid-19: Secondary | ICD-10-CM | POA: Insufficient documentation

## 2022-08-27 DIAGNOSIS — R000 Tachycardia, unspecified: Secondary | ICD-10-CM | POA: Diagnosis not present

## 2022-08-27 DIAGNOSIS — F419 Anxiety disorder, unspecified: Secondary | ICD-10-CM | POA: Diagnosis not present

## 2022-08-27 DIAGNOSIS — B9789 Other viral agents as the cause of diseases classified elsewhere: Secondary | ICD-10-CM | POA: Diagnosis not present

## 2022-08-27 DIAGNOSIS — R519 Headache, unspecified: Secondary | ICD-10-CM

## 2022-08-27 LAB — COMPREHENSIVE METABOLIC PANEL
ALT: 14 U/L (ref 0–44)
AST: 18 U/L (ref 15–41)
Albumin: 3.3 g/dL — ABNORMAL LOW (ref 3.5–5.0)
Alkaline Phosphatase: 79 U/L (ref 38–126)
Anion gap: 6 (ref 5–15)
BUN: 10 mg/dL (ref 6–20)
CO2: 21 mmol/L — ABNORMAL LOW (ref 22–32)
Calcium: 8.5 mg/dL — ABNORMAL LOW (ref 8.9–10.3)
Chloride: 108 mmol/L (ref 98–111)
Creatinine, Ser: 0.82 mg/dL (ref 0.44–1.00)
GFR, Estimated: >60 mL/min (ref >60)
Glucose, Bld: 123 mg/dL — ABNORMAL HIGH (ref 70–99)
Potassium: 4.1 mmol/L (ref 3.5–5.1)
Sodium: 135 mmol/L (ref 135–145)
Total Bilirubin: 0.2 mg/dL — ABNORMAL LOW (ref 0.3–1.2)
Total Protein: 6.6 g/dL (ref 6.5–8.1)

## 2022-08-27 LAB — CBC WITH DIFFERENTIAL/PLATELET
Abs Immature Granulocytes: 0.04 10*3/uL (ref 0.00–0.07)
Basophils Absolute: 0 10*3/uL (ref 0.0–0.1)
Basophils Relative: 0 %
Eosinophils Absolute: 0 10*3/uL (ref 0.0–0.5)
Eosinophils Relative: 0 %
HCT: 39.5 % (ref 36.0–46.0)
Hemoglobin: 13 g/dL (ref 12.0–15.0)
Immature Granulocytes: 0 %
Lymphocytes Relative: 16 %
Lymphs Abs: 1.8 10*3/uL (ref 0.7–4.0)
MCH: 30.3 pg (ref 26.0–34.0)
MCHC: 32.9 g/dL (ref 30.0–36.0)
MCV: 92.1 fL (ref 80.0–100.0)
Monocytes Absolute: 1 10*3/uL (ref 0.1–1.0)
Monocytes Relative: 8 %
Neutro Abs: 8.6 10*3/uL — ABNORMAL HIGH (ref 1.7–7.7)
Neutrophils Relative %: 76 %
Platelets: 368 10*3/uL (ref 150–400)
RBC: 4.29 MIL/uL (ref 3.87–5.11)
RDW: 13.2 % (ref 11.5–15.5)
WBC: 11.5 10*3/uL — ABNORMAL HIGH (ref 4.0–10.5)
nRBC: 0 % (ref 0.0–0.2)

## 2022-08-27 LAB — RESP PANEL BY RT-PCR (RSV, FLU A&B, COVID)  RVPGX2
Influenza A by PCR: NEGATIVE
Influenza B by PCR: NEGATIVE
Resp Syncytial Virus by PCR: NEGATIVE
SARS Coronavirus 2 by RT PCR: NEGATIVE

## 2022-08-27 LAB — GROUP A STREP BY PCR: Group A Strep by PCR: NOT DETECTED

## 2022-08-27 MED ORDER — KETOROLAC TROMETHAMINE 15 MG/ML IJ SOLN
15.0000 mg | Freq: Once | INTRAMUSCULAR | Status: AC
  Administered 2022-08-27: 15 mg via INTRAVENOUS
  Filled 2022-08-27: qty 1

## 2022-08-27 MED ORDER — LACTATED RINGERS IV BOLUS
1000.0000 mL | Freq: Once | INTRAVENOUS | Status: AC
  Administered 2022-08-27: 1000 mL via INTRAVENOUS

## 2022-08-27 MED ORDER — TOPIRAMATE 50 MG PO TABS: 50.0000 mg | ORAL_TABLET | Freq: Every day | ORAL | 0 refills | Status: DC

## 2022-08-27 NOTE — ED Triage Notes (Signed)
Pt arrived POV from home c/o back pain, a cough and a headache. Pt states she thinks she is withdrawing form topamax because she ran out 5 days ago.

## 2022-08-27 NOTE — ED Notes (Signed)
DC instructions reviewed with pt. Pt verbalized understanding.  PT DC.  

## 2022-08-27 NOTE — ED Provider Notes (Signed)
[facility] Provider Note   CSN: [id number] Arrival date & time: 08/27/22  0703     History  Chief Complaint  Patient presents with   Back Pain   Headache    Jasmine Fisher is a 47 y.o. female.  Patient is a 47 year old female with a history of schizophrenia/bipolar disease, anxiety, depression, seizures who is presenting today with multiple complaints.  Patient reports she thinks she is withdrawing from her Topamax because she normally takes 150 mg and she has run out of her 50 mg over the last 5 days and has felt more jittery, twitchy, visual disturbances.  However in addition to this in the last 4 to 5 days she has also had hot and cold chills, sore throat, nasal congestion, headaches.  She has not had a bowel movement in the last 5 days but reports she is eating.  She has had no nausea and vomiting.  She denies other sick contacts at home.  She did report taking her temperature last night and she thought it was 100 something.  She reports her body is aching all over but her neck especially is hurting.  She denies any vaginal or urinary symptoms but of note was seen a week and a half ago and at that time tested positive for gonorrhea, trichomonas and bacterial vaginosis.  She was treated through the health department appropriately and did receive the medications.  The history is provided by the patient and medical records.  Back Pain Associated symptoms: headaches   Headache Associated symptoms: back pain        Home Medications Prior to Admission medications   Medication Sig Start Date End Date Taking? Authorizing Provider  topiramate (TOPAMAX) 50 MG tablet Take 1 tablet (50 mg total) by mouth daily. Take 1 tab in the morning 08/27/22  Yes Winston Misner, Loree Fee, MD  Cariprazine HCl 4.5 MG CAPS Take 1 capsule (4.5 mg total) by mouth daily. 06/16/22   Vista Mink, MD  gabapentin (NEURONTIN) 600 MG tablet Take 2 tablets (1,200 mg total)  by mouth 2 (two) times daily. 06/16/22   Vista Mink, MD  ibuprofen (ADVIL) 600 MG tablet Take 1 tablet (600 mg total) by mouth every 8 (eight) hours as needed for up to 30 doses for fever, headache, mild pain or moderate pain (Inflammation). Take 1 tablet 3 times daily as needed for inflammation of upper airways and/or pain. Patient not taking: Reported on 05/12/2022 03/15/22   Lynden Oxford Scales, PA-C  metroNIDAZOLE (FLAGYL) 500 MG tablet Take 1 tablet (500 mg total) by mouth 2 (two) times daily. 08/19/22   Chase Picket, MD  topiramate (TOPAMAX) 100 MG tablet 100 MG IN AM 200 MG IN PM 05/05/22   Vista Mink, MD  venlafaxine XR (EFFEXOR-XR) 150 MG 24 hr capsule TAKE 1 CAPSULE (150 MG TOTAL) BY MOUTH DAILY. TAKE WITH 75 MG TAB FOR TOTAL OF 225 MG. 05/05/22 05/05/23  Vista Mink, MD  venlafaxine XR (EFFEXOR-XR) 75 MG 24 hr capsule TAKE 1 CAPSULE (75 MG TOTAL) BY MOUTH DAILY. TAKE WITH 150 MG CAPSULE FOR A TOTAL OF 225 MG DAILY 05/05/22 05/05/23  Vista Mink, MD      Allergies    Macrobid [nitrofurantoin] and Penicillins    Review of Systems   Review of Systems  Musculoskeletal:  Positive for back pain.  Neurological:  Positive for headaches.    Physical Exam Updated Vital Signs BP 116/64   Pulse  89   Temp 98.8 F (37.1 C)   Resp 13   Ht 5' (1.524 m)   Wt 99.8 kg   LMP 08/09/2022   SpO2 96%   BMI 42.97 kg/m  Physical Exam Vitals and nursing note reviewed.  Constitutional:      General: She is not in acute distress.    Appearance: She is well-developed.  HENT:     Head: Normocephalic and atraumatic.     Right Ear: Tympanic membrane normal.     Left Ear: Tympanic membrane normal.     Mouth/Throat:     Pharynx: Oropharyngeal exudate and posterior oropharyngeal erythema present.     Comments: Exam present on the left tonsil, erythema throughout the posterior pharynx Eyes:     Pupils: Pupils are equal, round, and reactive to light.  Cardiovascular:     Rate  and Rhythm: Regular rhythm. Tachycardia present.     Heart sounds: Normal heart sounds. No murmur heard.    No friction rub.  Pulmonary:     Effort: Pulmonary effort is normal.     Breath sounds: Normal breath sounds. No wheezing or rales.  Abdominal:     General: Bowel sounds are normal. There is no distension.     Palpations: Abdomen is soft.     Tenderness: There is no abdominal tenderness. There is no guarding or rebound.  Musculoskeletal:        General: Normal range of motion.     Cervical back: Tenderness present. No rigidity.     Right lower leg: No edema.     Left lower leg: No edema.     Comments: No edema  Lymphadenopathy:     Cervical: No cervical adenopathy.  Skin:    General: Skin is warm and dry.     Findings: No rash.  Neurological:     Mental Status: She is alert and oriented to person, place, and time. Mental status is at baseline.     Cranial Nerves: No cranial nerve deficit.     Sensory: No sensory deficit.     Motor: No weakness.     Comments: No clonus or hyperreflexia  Psychiatric:        Behavior: Behavior normal.     Comments: Mildly anxious     ED Results / Procedures / Treatments   Labs (all labs ordered are listed, but only abnormal results are displayed) Labs Reviewed  CBC WITH DIFFERENTIAL/PLATELET - Abnormal; Notable for the following components:      Result Value   WBC 11.5 (*)    Neutro Abs 8.6 (*)    All other components within normal limits  COMPREHENSIVE METABOLIC PANEL - Abnormal; Notable for the following components:   CO2 21 (*)    Glucose, Bld 123 (*)    Calcium 8.5 (*)    Albumin 3.3 (*)    Total Bilirubin 0.2 (*)    All other components within normal limits  GROUP A STREP BY PCR  RESP PANEL BY RT-PCR (RSV, FLU A&B, COVID)  [id number]    EKG None  Radiology DG Chest Port 1 View  Result Date: 08/27/2022 CLINICAL DATA:  Body pain and cough EXAM: PORTABLE CHEST 1 VIEW COMPARISON:  07/20/2011 FINDINGS: Chronic blunting of the  lateral right costophrenic sulcus. There is no edema, consolidation, effusion, or pneumothorax. Normal heart size and mediastinal contours. IMPRESSION: No acute finding, stable from 2012. Electronically Signed   By: Jorje Guild M.D.   On: 08/27/2022 08:32  Procedures Procedures    Medications Ordered in ED Medications  lactated ringers bolus 1,000 mL (1,000 mLs Intravenous New Bag/Given 08/27/22 0838)  ketorolac (TORADOL) 15 MG/ML injection 15 mg (15 mg Intravenous Given 08/27/22 7425)    ED Course/ Medical Decision Making/ A&P                             Medical Decision Making Amount and/or Complexity of Data Reviewed External Data Reviewed: notes.    Details: Urgent care Labs: ordered. Decision-making details documented in ED Course. Radiology: ordered and independent interpretation performed. Decision-making details documented in ED Course.  Risk Prescription drug management.   Pt with multiple medical problems and comorbidities and presenting today with a complaint that caries a high risk for morbidity and mortality.  Patient presenting today can [place] that she is withdrawing from Topamax however she is still taking 100 mg twice daily and has other symptoms concerning for possible infectious etiology.  Patient does have exudate on her tonsils and comfort concern for strep versus COVID.  Patient does have a prior history of viral meningitis however is not displaying significant minges neck symptoms currently.  She is hemodynamically stable at this time.  Will ensure no electrolyte abnormalities, COVID and strep are pending.  Patient given fluids, pain medication.  10:40 AM I independently interpreted patient's labs and CBC with minimal leukocytosis of 11 otherwise normal, CMP without acute findings, strep and viral panel are negative, I have independently visualized and interpreted pt's images today.  Chest x-ray without acute findings.  On repeat evaluation after fluids and  Toradol patient reports she feels much better.  At this time we will give her a prescription for her for 50 mg of Topamax and she is planning on following up in the near future with a PCP.  At this time no indication for further testing.  Most likely still has a viral illness causing her sore throat and headache.  Does not appear to have viral meningitis today based on exam.          Final Clinical Impression(s) / ED Diagnoses Final diagnoses:  Viral URI  Acute intractable headache, unspecified headache type    Rx / DC Orders ED Discharge Orders          Ordered    topiramate (TOPAMAX) 50 MG tablet  Daily        08/27/22 1039              Blanchie Dessert, MD 08/27/22 1040

## 2022-08-27 NOTE — Discharge Instructions (Signed)
If you start having trouble breathing, vomiting and unable to hold anything down, confusion return to the emergency room

## 2022-09-08 ENCOUNTER — Encounter (HOSPITAL_COMMUNITY): Payer: Self-pay | Admitting: Psychiatry

## 2022-09-08 ENCOUNTER — Ambulatory Visit (HOSPITAL_BASED_OUTPATIENT_CLINIC_OR_DEPARTMENT_OTHER): Payer: Medicare Other | Admitting: Psychiatry

## 2022-09-08 DIAGNOSIS — F603 Borderline personality disorder: Secondary | ICD-10-CM | POA: Diagnosis not present

## 2022-09-08 DIAGNOSIS — F205 Residual schizophrenia: Secondary | ICD-10-CM

## 2022-09-08 MED ORDER — VENLAFAXINE HCL ER 75 MG PO CP24: 75.0000 mg | ORAL_CAPSULE | Freq: Every day | ORAL | 0 refills | Status: AC

## 2022-09-08 MED ORDER — VENLAFAXINE HCL ER 150 MG PO CP24: 150.0000 mg | ORAL_CAPSULE | Freq: Every day | ORAL | 0 refills | Status: AC

## 2022-09-08 MED ORDER — CARIPRAZINE HCL 4.5 MG PO CAPS: 4.5000 mg | ORAL_CAPSULE | Freq: Every day | ORAL | 0 refills | Status: AC

## 2022-09-08 MED ORDER — GABAPENTIN 600 MG PO TABS: 1200.0000 mg | ORAL_TABLET | Freq: Two times a day (BID) | ORAL | 0 refills | Status: AC

## 2022-09-08 MED ORDER — TOPIRAMATE 50 MG PO TABS: 50.0000 mg | ORAL_TABLET | Freq: Every day | ORAL | 0 refills | Status: AC

## 2022-09-08 MED ORDER — TOPIRAMATE 100 MG PO TABS: ORAL_TABLET | ORAL | 0 refills | Status: AC
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
## 2022-10-14 ENCOUNTER — Telehealth (HOSPITAL_COMMUNITY): Payer: Self-pay | Admitting: *Deleted

## 2022-10-14 NOTE — Telephone Encounter (Signed)
Rx  REFILL FROM Rx PATIENT IS NO LONGER USING

## 2022-11-12 ENCOUNTER — Other Ambulatory Visit (HOSPITAL_COMMUNITY): Payer: Self-pay | Admitting: Psychiatry

## 2022-11-12 DIAGNOSIS — F205 Residual schizophrenia: Secondary | ICD-10-CM

## 2022-11-12 DIAGNOSIS — F603 Borderline personality disorder: Secondary | ICD-10-CM

## 2022-11-22 ENCOUNTER — Other Ambulatory Visit (HOSPITAL_COMMUNITY): Payer: Self-pay | Admitting: Psychiatry

## 2022-11-22 DIAGNOSIS — F603 Borderline personality disorder: Secondary | ICD-10-CM

## 2022-12-24 ENCOUNTER — Other Ambulatory Visit (HOSPITAL_COMMUNITY): Payer: Self-pay | Admitting: Psychiatry

## 2022-12-24 DIAGNOSIS — F603 Borderline personality disorder: Secondary | ICD-10-CM

## 2022-12-28 ENCOUNTER — Other Ambulatory Visit (HOSPITAL_COMMUNITY): Payer: Self-pay | Admitting: Psychiatry

## 2022-12-28 DIAGNOSIS — F603 Borderline personality disorder: Secondary | ICD-10-CM

## 2023-01-20 ENCOUNTER — Other Ambulatory Visit (HOSPITAL_COMMUNITY): Payer: Self-pay | Admitting: Psychiatry

## 2023-01-20 DIAGNOSIS — F603 Borderline personality disorder: Secondary | ICD-10-CM

## 2023-01-24 ENCOUNTER — Other Ambulatory Visit (HOSPITAL_COMMUNITY): Payer: Self-pay | Admitting: Psychiatry

## 2023-01-24 DIAGNOSIS — F603 Borderline personality disorder: Secondary | ICD-10-CM

## 2023-04-29 ENCOUNTER — Ambulatory Visit (HOSPITAL_COMMUNITY)
Admission: EM | Admit: 2023-04-29 | Discharge: 2023-04-29 | Disposition: A | Discharge status: Home / Self Care | Payer: Medicare HMO | Attending: Internal Medicine | Admitting: Internal Medicine

## 2023-04-29 ENCOUNTER — Encounter (HOSPITAL_COMMUNITY): Payer: Self-pay

## 2023-04-29 DIAGNOSIS — F17210 Nicotine dependence, cigarettes, uncomplicated: Secondary | ICD-10-CM | POA: Diagnosis not present

## 2023-04-29 DIAGNOSIS — Z1152 Encounter for screening for COVID-19: Secondary | ICD-10-CM | POA: Diagnosis not present

## 2023-04-29 DIAGNOSIS — J069 Acute upper respiratory infection, unspecified: Secondary | ICD-10-CM | POA: Insufficient documentation

## 2023-04-29 DIAGNOSIS — J4521 Mild intermittent asthma with (acute) exacerbation: Secondary | ICD-10-CM | POA: Insufficient documentation

## 2023-04-29 MED ORDER — PREDNISONE 20 MG PO TABS: 40.0000 mg | ORAL_TABLET | Freq: Every day | ORAL | 0 refills | Status: AC

## 2023-04-29 MED ORDER — ACETAMINOPHEN 325 MG PO TABS
975.0000 mg | ORAL_TABLET | Freq: Once | ORAL | Status: AC
  Administered 2023-04-29: 975 mg via ORAL

## 2023-04-29 MED ORDER — ALBUTEROL SULFATE (2.5 MG/3ML) 0.083% IN NEBU
2.5000 mg | INHALATION_SOLUTION | Freq: Once | RESPIRATORY_TRACT | Status: AC
  Administered 2023-04-29: 2.5 mg via RESPIRATORY_TRACT

## 2023-04-29 MED ORDER — DEXAMETHASONE SODIUM PHOSPHATE 10 MG/ML IJ SOLN
10.0000 mg | Freq: Once | INTRAMUSCULAR | Status: AC
  Administered 2023-04-29: 10 mg via INTRAMUSCULAR

## 2023-04-29 MED ORDER — DEXAMETHASONE SODIUM PHOSPHATE 10 MG/ML IJ SOLN
INTRAMUSCULAR | Status: AC
  Filled 2023-04-29: qty 1

## 2023-04-29 MED ORDER — ALBUTEROL SULFATE HFA 108 (90 BASE) MCG/ACT IN AERS
1.0000 | INHALATION_SPRAY | Freq: Four times a day (QID) | RESPIRATORY_TRACT | 0 refills | Status: AC | PRN
Start: 2023-04-29 — End: ?

## 2023-04-29 MED ORDER — ALBUTEROL SULFATE (2.5 MG/3ML) 0.083% IN NEBU
INHALATION_SOLUTION | RESPIRATORY_TRACT | Status: AC
  Filled 2023-04-29: qty 3

## 2023-04-29 MED ORDER — ACETAMINOPHEN 325 MG PO TABS
ORAL_TABLET | ORAL | Status: AC
  Filled 2023-04-29: qty 3

## 2023-04-29 NOTE — ED Triage Notes (Signed)
Pt presents to the office for SOB, sore throat and cough x 3 days. Pt reports a history of Asthma, but she is not using an inhaler to help with symptoms.
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
## 2023-04-29 NOTE — Discharge Instructions (Signed)
Your symptoms are due to asthma attack. COVID testing is pending, staff will call you if this is positive. Wear a mask for 5 days of symptoms while you are in public, then you may remove your mask. You may go back to work if you do not have a fever for 24 hours without any medicines.  - Use albuterol every 4-6 hours as needed for cough, shortness of breath, and wheeze. - Take the steroid sent to the pharmacy as directed to help reduce lung inflammation and decrease the risk of another attack in the next few days. No NSAIDs like ibuprofen or aleve/naproxen while taking steroid. Take with food to avoid stomach upset. - Take prescribed cough medicine as directed.  If your symptoms do not improve in the next 2-3 days with interventions, please return. Please seek medical care for new or returning symptoms, such as difficulty breathing that doesn't improve with your medications, chest pain, voice changes, high fevers, confusion, or other new or worsening symptoms. Follow-up with PCP for ongoing management of asthma.

## 2023-04-30 LAB — SARS CORONAVIRUS 2 (TAT 6-24 HRS): SARS Coronavirus 2: NEGATIVE
